# Patient Record
Sex: Male | Born: 1965 | Race: White | Hispanic: No | Marital: Married | Smoking: Never smoker
Health system: Southern US, Community
[De-identification: ages and names within clinical notes are randomized; demographics above are authoritative.]

## PROBLEM LIST (undated history)

## (undated) DIAGNOSIS — I10 Essential (primary) hypertension: Secondary | ICD-10-CM

## (undated) DIAGNOSIS — N200 Calculus of kidney: Secondary | ICD-10-CM

## (undated) DIAGNOSIS — K219 Gastro-esophageal reflux disease without esophagitis: Secondary | ICD-10-CM

## (undated) HISTORY — PX: COLONOSCOPY: SHX174

## (undated) HISTORY — DX: Gastro-esophageal reflux disease without esophagitis: K21.9

## (undated) HISTORY — PX: INGUINAL HERNIA REPAIR: SHX194

## (undated) HISTORY — PX: UPPER GASTROINTESTINAL ENDOSCOPY: SHX188

---

## 1998-11-27 ENCOUNTER — Emergency Department (HOSPITAL_COMMUNITY): Admission: EM | Admit: 1998-11-27 | Discharge: 1998-11-27 | Payer: Self-pay | Admitting: *Deleted

## 2006-02-09 ENCOUNTER — Emergency Department (HOSPITAL_COMMUNITY): Admission: EM | Admit: 2006-02-09 | Discharge: 2006-02-09 | Payer: Self-pay | Admitting: Emergency Medicine

## 2007-08-24 ENCOUNTER — Ambulatory Visit: Payer: Self-pay | Admitting: Internal Medicine

## 2007-08-24 DIAGNOSIS — I10 Essential (primary) hypertension: Secondary | ICD-10-CM | POA: Insufficient documentation

## 2007-08-24 DIAGNOSIS — J309 Allergic rhinitis, unspecified: Secondary | ICD-10-CM | POA: Insufficient documentation

## 2007-08-24 DIAGNOSIS — R079 Chest pain, unspecified: Secondary | ICD-10-CM | POA: Insufficient documentation

## 2007-08-25 ENCOUNTER — Ambulatory Visit (HOSPITAL_COMMUNITY): Admission: RE | Admit: 2007-08-25 | Discharge: 2007-08-25 | Payer: Self-pay | Admitting: Internal Medicine

## 2007-09-06 ENCOUNTER — Ambulatory Visit: Payer: Self-pay | Admitting: Internal Medicine

## 2011-08-10 ENCOUNTER — Encounter (HOSPITAL_COMMUNITY): Payer: Self-pay | Admitting: Emergency Medicine

## 2011-08-10 ENCOUNTER — Emergency Department (HOSPITAL_COMMUNITY)
Admission: EM | Admit: 2011-08-10 | Discharge: 2011-08-10 | Disposition: A | Discharge status: Home Health | Payer: BC Managed Care – PPO | Attending: Emergency Medicine | Admitting: Emergency Medicine

## 2011-08-10 ENCOUNTER — Emergency Department (HOSPITAL_COMMUNITY): Payer: BC Managed Care – PPO

## 2011-08-10 DIAGNOSIS — R319 Hematuria, unspecified: Secondary | ICD-10-CM | POA: Insufficient documentation

## 2011-08-10 DIAGNOSIS — R109 Unspecified abdominal pain: Secondary | ICD-10-CM | POA: Insufficient documentation

## 2011-08-10 DIAGNOSIS — R300 Dysuria: Secondary | ICD-10-CM | POA: Insufficient documentation

## 2011-08-10 DIAGNOSIS — N200 Calculus of kidney: Secondary | ICD-10-CM | POA: Insufficient documentation

## 2011-08-10 DIAGNOSIS — R10819 Abdominal tenderness, unspecified site: Secondary | ICD-10-CM | POA: Insufficient documentation

## 2011-08-10 DIAGNOSIS — I10 Essential (primary) hypertension: Secondary | ICD-10-CM | POA: Insufficient documentation

## 2011-08-10 DIAGNOSIS — Z79899 Other long term (current) drug therapy: Secondary | ICD-10-CM | POA: Insufficient documentation

## 2011-08-10 DIAGNOSIS — R350 Frequency of micturition: Secondary | ICD-10-CM | POA: Insufficient documentation

## 2011-08-10 HISTORY — DX: Essential (primary) hypertension: I10

## 2011-08-10 HISTORY — DX: Calculus of kidney: N20.0

## 2011-08-10 LAB — CBC
HCT: 45.1 % (ref 39.0–52.0)
Hemoglobin: 15.6 g/dL (ref 13.0–17.0)
RDW: 13 % (ref 11.5–15.5)
WBC: 12.8 10*3/uL — ABNORMAL HIGH (ref 4.0–10.5)

## 2011-08-10 LAB — DIFFERENTIAL
Basophils Absolute: 0 10*3/uL (ref 0.0–0.1)
Lymphocytes Relative: 10 % — ABNORMAL LOW (ref 12–46)
Monocytes Absolute: 1.8 10*3/uL — ABNORMAL HIGH (ref 0.1–1.0)
Monocytes Relative: 14 % — ABNORMAL HIGH (ref 3–12)
Neutro Abs: 9.5 10*3/uL — ABNORMAL HIGH (ref 1.7–7.7)
Neutrophils Relative %: 74 % (ref 43–77)

## 2011-08-10 LAB — URINALYSIS, ROUTINE W REFLEX MICROSCOPIC
Glucose, UA: NEGATIVE mg/dL
Leukocytes, UA: NEGATIVE
pH: 5.5 (ref 5.0–8.0)

## 2011-08-10 LAB — BASIC METABOLIC PANEL
CO2: 27 mEq/L (ref 19–32)
Chloride: 99 mEq/L (ref 96–112)
Creatinine, Ser: 1.51 mg/dL — ABNORMAL HIGH (ref 0.50–1.35)
Potassium: 3.8 mEq/L (ref 3.5–5.1)

## 2011-08-10 MED ORDER — MORPHINE SULFATE 4 MG/ML IJ SOLN: 4.0000 mg | Freq: Once | INTRAMUSCULAR | Status: DC

## 2011-08-10 MED ORDER — ONDANSETRON HCL 4 MG/2ML IJ SOLN: 4.0000 mg | INTRAMUSCULAR | Status: DC | PRN

## 2011-08-10 MED ORDER — NAPROXEN 500 MG PO TABS: 500.0000 mg | ORAL_TABLET | Freq: Two times a day (BID) | ORAL | Status: AC

## 2011-08-10 NOTE — ED Notes (Signed)
Pt c/o left flank pain x 5 days intermittently that feels similar to when had kidney stone in past; pt denies obvious blood in urine ;pt sts some fever also

## 2011-08-10 NOTE — Discharge Instructions (Signed)
You have been diagnosed with kidney stones.  Use your pain medication as prescribed and do not operate heavy machinery while on this medication. Note that your pain medication contains Acetaminophen (Tylenol), therefore it is not recommended to take additional Tylenol while on your pain medication. Continue to drink fluids to help you pass the stones.  Use Zofran for nausea as directed.  Followup with your primary care doctor in regards to your hospital visit.  Return to the ED immediately if you develop fever that persists > 101, uncontrolled pain or vomiting, or other concerns.  Read the instructions below to learn more about kidney stones.  If you do not have a primary care doctor to followup with, use the resource guide attached to help you find one.   Kidney Stones Kidney stones (ureteral lithiasis) are solid masses that form inside your kidneys. The intense pain is caused by the stone moving through the kidney, ureter, bladder, and urethra (urinary tract). When the stone moves, the ureter starts to spasm around the stone. The stone is usually passed in the urine.  HOME CARE  Drink enough fluids to keep your pee (urine) clear or pale yellow. This helps to get the stone out.   Only take medicine as told by your doctor.   Follow up with your doctor as told.  GET HELP RIGHT AWAY IF:   Your pain does not get better with medicine.   You have a fever.   Your pain increases and gets worse over 18 hours.   You have new belly (abdominal) pain.   You feel faint or pass out.  MAKE SURE YOU:   Understand these instructions.   Will watch your condition.   Will get help right away if you are not doing well or get worse.   RESOURCE GUIDE  Dental Problems  Patients with Medicaid: [facility]                     [facility] [address]                                           [address] Phone:  [phone]                                                   Phone:  [phone]  If unable to pay or uninsured, contact:  [facility] or [facility] to become qualified for the adult dental clinic.  Chronic Pain Problems Contact  [facility]  [phone] Patients need to be referred by their primary care doctor.  Insufficient Money for Medicine Contact [employer]:  call "211" or [facility] 271-5999.  No Primary Care Doctor Call [facility]  832-8000 Other agencies that provide inexpensive medical care    [facility]  [phone]    [facility]  [phone]    [facility]  [phone]    [facility] Clinic  [phone]    [facility]  [phone]    [facility]  [phone]  Psychological Services [facility]  [phone] [facility]  [phone] [facility]   800 853-5163 (emergency services 641-4993)    Substance Abuse Resources [facility]  336-882-2125 [facility] 336-784-9470 [facility] 336-285-9073 [facility] 336-845-3988 Residential & Outpatient Substance Abuse Program  800-659-3381  Abuse/Neglect [place] Child Abuse Hotline (336) 641-3795 [place] Child Abuse Hotline 800-378-5315 (After Hours)  Emergency Shelter [facility] (336) 271-5985  [facility] (336) 275-9566 [facility] (704) 372-4663  MRSA Hotline #:   832-7006    [place] [facility]  [facility] [place]     [employer]                          [facility] [address] [place]                       [address]      [address]  [place]                                                [place]                            [place] Phone:  [phone]                                   Phone:  [phone]                 Phone:  [phone]  [facility] Phone:   [phone]  [place] Child Abuse Hotline (336) 342-1394 (336) 342-3537 (After Hours)   

## 2011-08-10 NOTE — ED Notes (Signed)
Patient C/O Left flank pain that has been ongoing since Wednesday.  States that he has had multiple kidney stones and he has another. Denies hematuria. Pain 4/10. Pain goal 5/10

## 2011-08-10 NOTE — ED Provider Notes (Signed)
History     CSN: [id number]  Arrival date & time 08/10/11  1251   First MD Initiated Contact with Patient 08/10/11 1326      Chief Complaint  Patient presents with  . Flank Pain    (Consider location/radiation/quality/duration/timing/severity/associated sxs/prior treatment) HPI Comments: Patient with a history of hypertension kidney stones presents emergency Department with new onset of right-sided flank pain.  Patient states that onset began 5 days ago and pain has been intermittent and sharp in nature.  Patient was evaluated Friday in an urgent care however no imaging was performed.  Patient feels as though he has a kidney stone and it has not passed.  He states concern that this still might be too large to pass.  He denies urinary symptoms including dysuria, frequency, hematuria, decreased urination.  Patient reports that he's had intermittent fevers for the last couple of days as well, with the highest being 100F. Denies N/V/V, abdominal pain, chest pain, diaphoreses, SOB, claudication or palpitations. LNBM was on Thursday (pt has been taking narcotics given to him by urgent care).  Patient is a 46 y.o. male presenting with flank pain. The history is provided by the patient.  Flank Pain    Past Medical History  Diagnosis Date  . Hypertension   . Kidney stone     History reviewed. No pertinent past surgical history.  History reviewed. No pertinent family history.  History  Substance Use Topics  . Smoking status: Never Smoker   . Smokeless tobacco: Not on file  . Alcohol Use: No      Review of Systems  Genitourinary: Positive for flank pain.  All other systems reviewed and are negative.    Allergies  Sulfonamide derivatives  Home Medications   Current Outpatient Rx  Name Route Sig Dispense Refill  . CIPROFLOXACIN HCL 500 MG PO TABS Oral Take 500 mg by mouth 2 (two) times daily.    . IBUPROFEN 200 MG PO TABS Oral Take 400 mg by mouth every 6 (six) hours as  needed. For pain    . LISINOPRIL 40 MG PO TABS Oral Take 40 mg by mouth daily.    . ADULT MULTIVITAMIN W/MINERALS CH Oral Take 1 tablet by mouth daily.    . OXYCODONE-ACETAMINOPHEN 5-325 MG PO TABS Oral Take 1-2 tablets by mouth every 4 (four) hours as needed. For pain    . TAMSULOSIN HCL 0.4 MG PO CAPS Oral Take 0.4 mg by mouth daily.      BP 132/84  Pulse 70  Temp(Src) 98.5 F (36.9 C) (Oral)  Resp 20  SpO2 95%  Physical Exam  Nursing note and vitals reviewed. Constitutional: He is oriented to person, place, and time. He appears well-developed and well-nourished. No distress.  HENT:  Head: Normocephalic and atraumatic.  Eyes: Conjunctivae and EOM are normal.  Neck: Normal range of motion. Neck supple.  Cardiovascular: Normal rate, regular rhythm and normal heart sounds.   Pulmonary/Chest: Effort normal.  Abdominal: Soft. Normal appearance and bowel sounds are normal. He exhibits no distension, no ascites and no pulsatile midline mass. There is tenderness (CVA tenderness). There is CVA tenderness.  Musculoskeletal: Normal range of motion.  Neurological: He is alert and oriented to person, place, and time.  Skin: Skin is warm and dry. He is not diaphoretic.  Psychiatric: He has a normal mood and affect. His behavior is normal.    ED Course  Procedures (including critical care time)  Labs Reviewed  CBC - Abnormal; Notable for the  following:    WBC 12.8 (*)    All other components within normal limits  DIFFERENTIAL - Abnormal; Notable for the following:    Neutro Abs 9.5 (*)    Lymphocytes Relative 10 (*)    Monocytes Relative 14 (*)    Monocytes Absolute 1.8 (*)    All other components within normal limits  URINALYSIS, ROUTINE W REFLEX MICROSCOPIC  BASIC METABOLIC PANEL   Ct Abdomen Pelvis Wo Contrast  08/10/2011  *RADIOLOGY REPORT*  Clinical Data: Left flank pain  CT ABDOMEN AND PELVIS WITHOUT CONTRAST  Technique:  Multidetector CT imaging of the abdomen and pelvis was  performed following the standard protocol without intravenous contrast.  Comparison: None.  Findings: 2 mm calculus at the left ureteral vesicle junction is associated with mild left hydronephrosis.  1 cm focal hyperdensity in the upper pole of the left kidney on image 37. This is indeterminate.  Simple cyst in the lower pole of the left kidney.  1 cm hypodensity in the posterior interpolar right kidney with the rim calcifications. This is also indeterminate.  Ill-defined 1.5 cm hypodensity in the right lobe of the liver on image 34.  Extensive stool in the ascending and transverse colon.  Pancreas, spleen, adrenal glands are within normal limits.  Normal appendix.  No destructive bone lesion.  IMPRESSION: 2 mm distal left ureteral calculus associated with left hydronephrosis.  Indeterminate lesions in the liver and kidneys.  If the patient is at high risk for malignancy, MRI is recommended to further characterize.  Otherwise, 38-month follow-up studies recommended to ensure stability.  Original Report Authenticated By: Donavan Burnet, M.D.     No diagnosis found.    MDM  Kidney stone  Pt has been diagnosed with a Kidney Stone via CT. There is no evidence of significant hydronephrosis, serum creatine WNL, vitals sign stable and the pt does not have irratractable vomiting. Pt will be dc home with pain medications & has been advised to follow up with PCP. Imaging results discussed w pt. He is a non-smoker with no CA hx personally and no fam hx of CA as well. Pt has been advised to have a repeat scan for follow up in 6 month. He is agreeable w plan adn verbalizes understanding.          Jaci Carrel, [place] 08/10/11 4401

## 2011-08-11 NOTE — ED Provider Notes (Signed)
Medical screening examination/treatment/procedure(s) were performed by non-physician practitioner and as supervising physician I was immediately available for consultation/collaboration.   Esparanza Krider, MD 08/11/11 1453 

## 2011-08-28 ENCOUNTER — Other Ambulatory Visit: Payer: Self-pay | Admitting: Family Medicine

## 2011-08-28 DIAGNOSIS — K7689 Other specified diseases of liver: Secondary | ICD-10-CM

## 2011-08-28 DIAGNOSIS — N289 Disorder of kidney and ureter, unspecified: Secondary | ICD-10-CM

## 2011-09-01 ENCOUNTER — Ambulatory Visit
Admission: RE | Admit: 2011-09-01 | Discharge: 2011-09-01 | Disposition: A | Discharge status: Home / Self Care | Payer: BC Managed Care – PPO | Source: Ambulatory Visit | Attending: Family Medicine | Admitting: Family Medicine

## 2011-09-01 DIAGNOSIS — N289 Disorder of kidney and ureter, unspecified: Secondary | ICD-10-CM

## 2011-09-01 DIAGNOSIS — K7689 Other specified diseases of liver: Secondary | ICD-10-CM

## 2011-09-01 MED ORDER — GADOBENATE DIMEGLUMINE 529 MG/ML IV SOLN
19.0000 mL | Freq: Once | INTRAVENOUS | Status: AC | PRN
  Administered 2011-09-01: 19 mL via INTRAVENOUS

## 2012-12-19 ENCOUNTER — Ambulatory Visit: Payer: Self-pay | Admitting: Family Medicine

## 2012-12-21 ENCOUNTER — Other Ambulatory Visit: Payer: Self-pay | Admitting: Family Medicine

## 2012-12-21 MED ORDER — LISINOPRIL 40 MG PO TABS: 40.0000 mg | ORAL_TABLET | Freq: Every day | ORAL | Status: DC

## 2012-12-21 NOTE — Telephone Encounter (Signed)
.  Rx Refilled and pt needs ov before further refills

## 2012-12-22 ENCOUNTER — Ambulatory Visit (INDEPENDENT_AMBULATORY_CARE_PROVIDER_SITE_OTHER): Payer: BC Managed Care – PPO | Admitting: Family Medicine

## 2012-12-22 ENCOUNTER — Encounter: Payer: Self-pay | Admitting: Family Medicine

## 2012-12-22 VITALS — BP 110/80 | HR 78 | Temp 98.2°F | Resp 16 | Wt 218.0 lb

## 2012-12-22 DIAGNOSIS — R51 Headache: Secondary | ICD-10-CM

## 2012-12-22 DIAGNOSIS — I10 Essential (primary) hypertension: Secondary | ICD-10-CM

## 2012-12-22 MED ORDER — FLUTICASONE PROPIONATE 50 MCG/ACT NA SUSP: 2.0000 | Freq: Every day | NASAL | Status: DC

## 2012-12-22 NOTE — Progress Notes (Signed)
Subjective:    Patient ID: Bryan Davenport, Bryan Davenport    DOB: 07/27/1965, 47 y.o.   MRN: [id number]  HPI  Patient comes in today followup hypertension. He has not been taking lisinopril regularly. In fact he takes it every third day. He has not taken medication over 48 hours. His blood pressure today is 110/80. He denies any chest pain, shortness of breath, dyspnea on exertion. He refuses a flu shot. He refuses a CMP and a fasting lipid panel.  He does complain of a chronic daily headache. It is located over his frontal sinuses and maxillary sinuses. This has been episodic for years. He does have congestion. However he also reports some photophobia and some phonophobia. Sometimes smells trigger his headache. He takes Advil almost on a daily basis for headache. Again this is been occurring for years. Past Medical History  Diagnosis Date  . Hypertension   . Kidney stone     Current Outpatient Prescriptions on File Prior to Visit  Medication Sig Dispense Refill  . ibuprofen (ADVIL,MOTRIN) 200 MG tablet Take 400 mg by mouth every 6 (six) hours as needed. For pain      . lisinopril (PRINIVIL,ZESTRIL) 40 MG tablet Take 1 tablet (40 mg total) by mouth daily.  90 tablet  0  . Multiple Vitamin (MULITIVITAMIN WITH MINERALS) TABS Take 1 tablet by mouth daily.       No current facility-administered medications on file prior to visit.   Allergies  Allergen Reactions  . Sulfonamide Derivatives Rash   History   Social History  . Marital Status: Married    Spouse Name: N/A    Number of Children: N/A  . Years of Education: N/A   Occupational History  . Not on file.   Social History Main Topics  . Smoking status: Never Smoker   . Smokeless tobacco: Not on file  . Alcohol Use: No  . Drug Use: No  . Sexual Activity:    Other Topics Concern  . Not on file   Social History Narrative  . No narrative on file     Review of Systems  All other systems reviewed and are negative.        Objective:   Physical Exam  Vitals reviewed. Constitutional: He is oriented to person, place, and time. He appears well-developed and well-nourished.  HENT:  Nose: Nose normal.  Mouth/Throat: Oropharynx is clear and moist.  Cardiovascular: Normal rate, regular rhythm and normal heart sounds.  Exam reveals no gallop and no friction rub.   No murmur heard. Pulmonary/Chest: Effort normal and breath sounds normal. No respiratory distress. He has no wheezes. He has no rales.  Abdominal: Soft. Bowel sounds are normal. He exhibits no distension. There is no tenderness. There is no rebound and no guarding.  Neurological: He is alert and oriented to person, place, and time. He has normal reflexes.          Assessment & Plan:  1. HTN (hypertension) Patient declines all lab work. I recommended discontinuing lisinopril. I asked the patient to monitor his blood pressure daily at home. His blood pressure rises greater than 140/90 I would resume the medication. At the present time it seems like it may be controlled without medication.  2. Chronic headaches I believe there is a multi-factorial cause to his headaches. He may be having atypical migraines. He quite possibly has rebound headaches. There may also be an element of chronic sinusitis.  Begin Flonase 2 sprays each nostril daily.  I  also recommended he discontinue all over-the-counter Tylenol and NSAIDs. Recheck in 3 weeks. If headaches do not improve consider Topamax. - fluticasone (FLONASE) 50 MCG/ACT nasal spray; Place 2 sprays into the nose daily.  Dispense: 16 g; Refill: 6

## 2013-02-23 ENCOUNTER — Ambulatory Visit (INDEPENDENT_AMBULATORY_CARE_PROVIDER_SITE_OTHER): Payer: BC Managed Care – PPO | Admitting: Physician Assistant

## 2013-02-23 ENCOUNTER — Encounter: Payer: Self-pay | Admitting: Physician Assistant

## 2013-02-23 VITALS — BP 146/102 | HR 80 | Temp 98.8°F | Resp 20 | Wt 220.0 lb

## 2013-02-23 DIAGNOSIS — A499 Bacterial infection, unspecified: Secondary | ICD-10-CM

## 2013-02-23 DIAGNOSIS — J988 Other specified respiratory disorders: Secondary | ICD-10-CM

## 2013-02-23 MED ORDER — AZITHROMYCIN 250 MG PO TABS: ORAL_TABLET | ORAL | Status: DC

## 2013-02-23 NOTE — Progress Notes (Signed)
    Patient ID: Bryan Davenport MRN: [id number], DOB: 1965/12/16, 47 y.o. Date of Encounter: 02/23/2013, 4:02 PM    Chief Complaint:  Chief Complaint  Patient presents with  . congestion, body aches, sore throat    x 5 days, using OTC Acetominophen Sinus     HPI: 47 y.o. year old white male reports he has had increased nasal congestion and pressure behind his ears. Past 2 mornings, has hadsever sore throat-gets better during the day. Not able to get much mucus out of nose. Feels a little chest conestion but not able to get any phlegm out.      Home Meds: See attached medication section for any medications that were entered at today's visit. The computer does not put those onto this list.The following list is a list of meds entered prior to today's visit.   Current Outpatient Prescriptions on File Prior to Visit  Medication Sig Dispense Refill  . ibuprofen (ADVIL,MOTRIN) 200 MG tablet Take 400 mg by mouth every 6 (six) hours as needed. For pain      . lisinopril (PRINIVIL,ZESTRIL) 40 MG tablet Take 1 tablet (40 mg total) by mouth daily.  90 tablet  0  . Multiple Vitamin (MULITIVITAMIN WITH MINERALS) TABS Take 1 tablet by mouth daily.      . fluticasone (FLONASE) 50 MCG/ACT nasal spray Place 2 sprays into the nose daily.  16 g  6   No current facility-administered medications on file prior to visit.    Allergies:  Allergies  Allergen Reactions  . Sulfonamide Derivatives Rash      Review of Systems: See HPI for pertinent ROS. All other ROS negative.    Physical Exam: Blood pressure 146/92, pulse 80, temperature 98.8 F (37.1 C), temperature source Oral, resp. rate 20, weight 220 lb (99.791 kg)., Body mass index is 30.7 kg/(m^2). General:  WNWD WM. Appears in no acute distress. HEENT: Normocephalic, atraumatic, eyes without discharge, sclera non-icteric, nares are without discharge. Bilateral auditory canals clear, TM's are without perforation, pearly grey and translucent with  reflective cone of light bilaterally. Oral cavity moist, posterior pharynx without exudate, erythema, peritonsillar abscess. Mild tenderness with percussion of frontal sinuses. No tenderness with percussion of maxillary sinuses.   Neck: Supple. No thyromegaly. No lymphadenopathy. Lungs: Clear bilaterally to auscultation without wheezes, rales, or rhonchi. Breathing is unlabored. Heart: Regular rhythm. No murmurs, rubs, or gallops. Msk:  Strength and tone normal for age. Extremities/Skin: Warm and dry. No clubbing or cyanosis. No edema. No rashes or suspicious lesions. Neuro: Alert and oriented X 3. Moves all extremities spontaneously. Gait is normal. CNII-XII grossly in tact. Psych:  Responds to questions appropriately with a normal affect.     ASSESSMENT AND PLAN:  47 y.o. year old male with  1. Bacterial respiratory infection - azithromycin (ZITHROMAX) 250 MG tablet; Day 1: Take 2 daily.  Days 2-5: Take 1 daily.  Dispense: 6 tablet; Refill: 0 Rec Mucinex DM as expectorant. F/U if does not resolve.   Murray Hodgkins Dawson, [place], [facility] 02/23/2013 4:02 PM

## 2013-05-08 IMAGING — CT CT ABD-PELV W/O CM
2 of 4 series · 14 of 32 positions shown, 19 images · non-contrast
Comparison: None.

CLINICAL DATA: Left flank pain

CT ABDOMEN AND PELVIS WITHOUT CONTRAST
TECHNIQUE: Multidetector CT imaging of the abdomen and pelvis was
performed following the standard protocol without intravenous
contrast.

[Series 2: renal stone · axial · 0.77mm/px · z∈[-452,-42]mm · 8 of 106 slices shown, 13 images]
[im 12/106  soft-tissue]
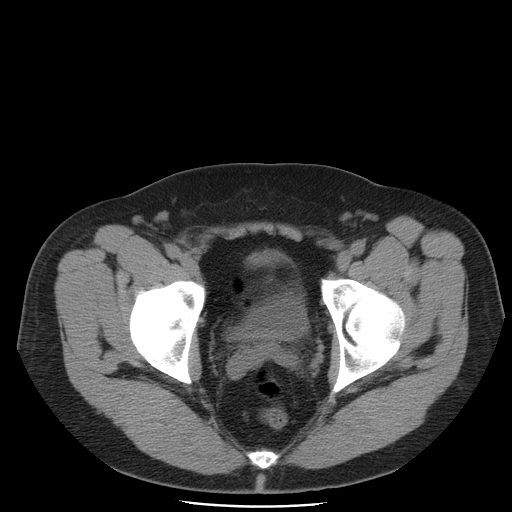
[im 12/106  bone]
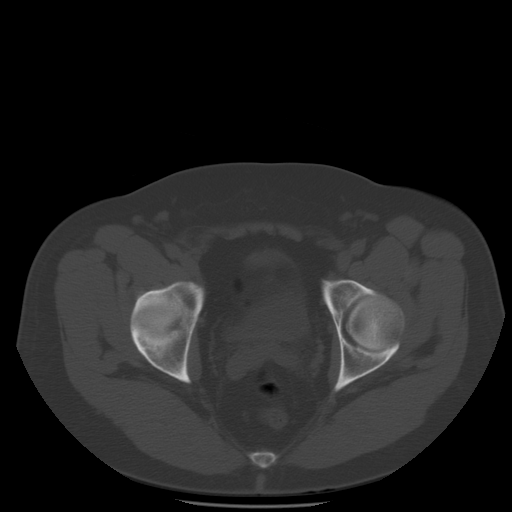
[im 24/106  soft-tissue]
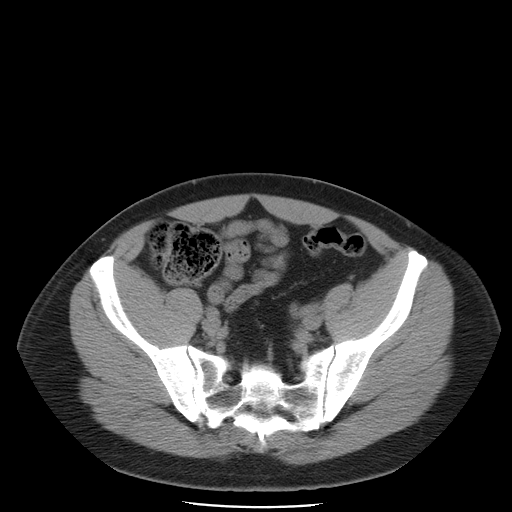
[im 36/106  soft-tissue]
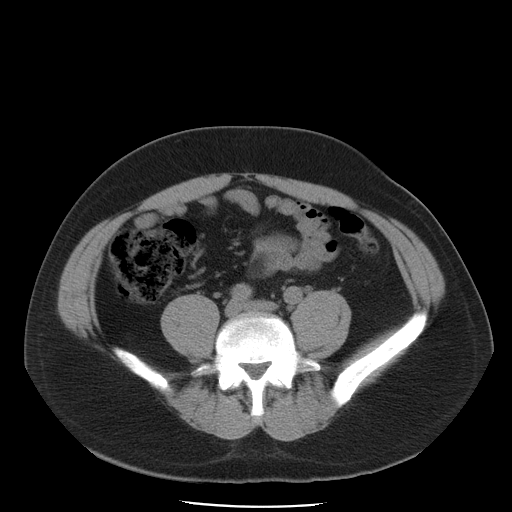
[im 47/106  soft-tissue]
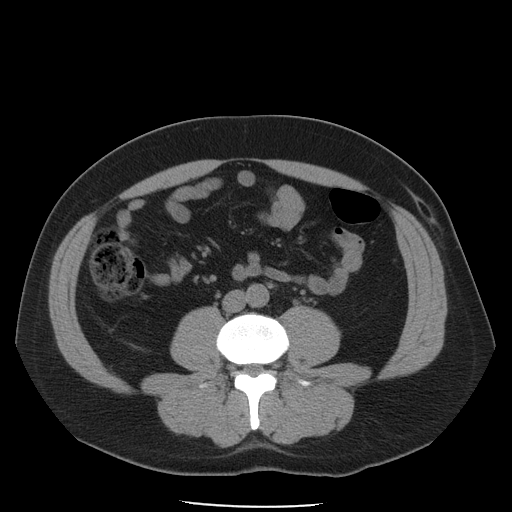
[im 59/106  soft-tissue]
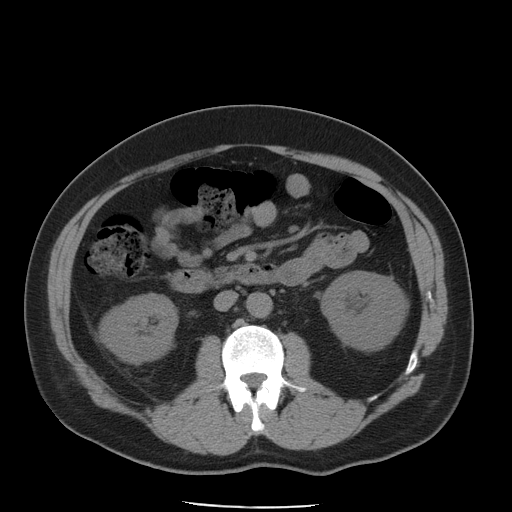
[im 59/106  lung]
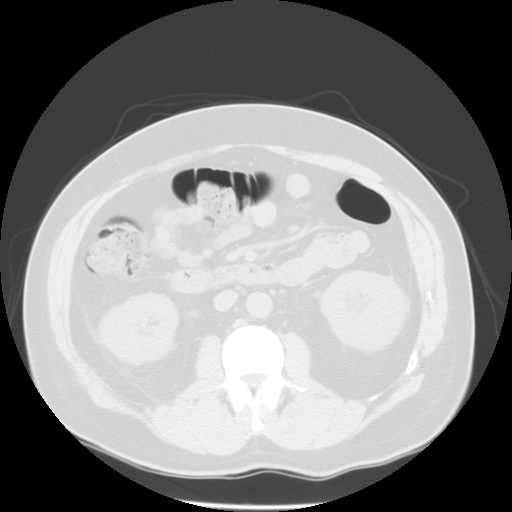
[im 71/106  soft-tissue]
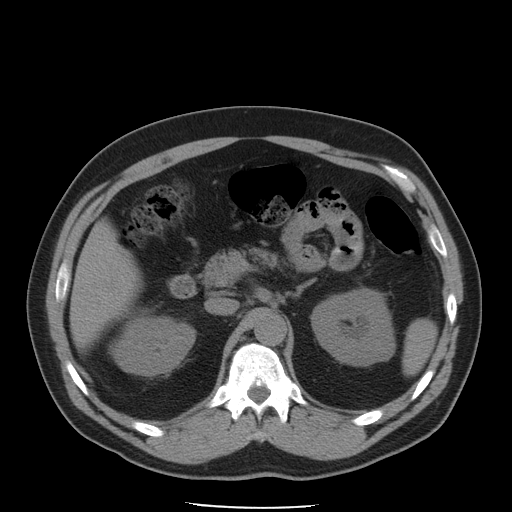
[im 71/106  lung]
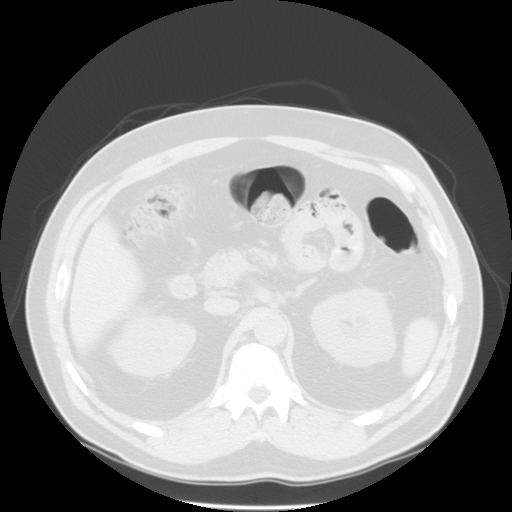
[im 82/106  soft-tissue]
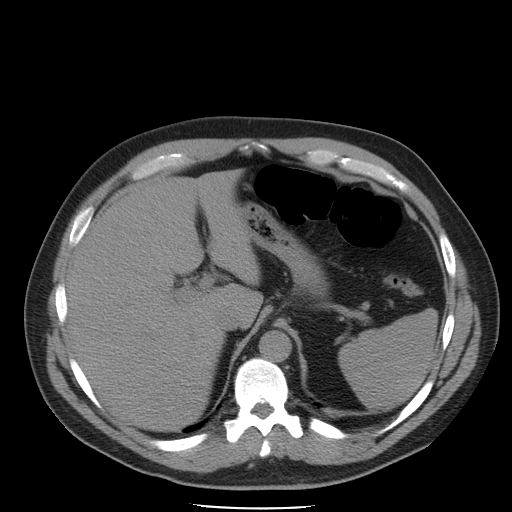
[im 82/106  lung]
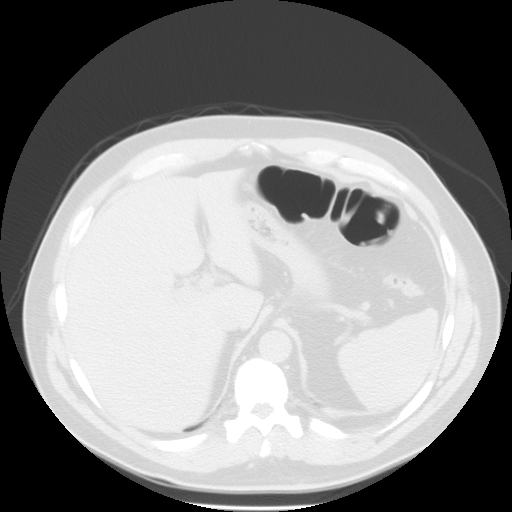
[im 94/106  soft-tissue]
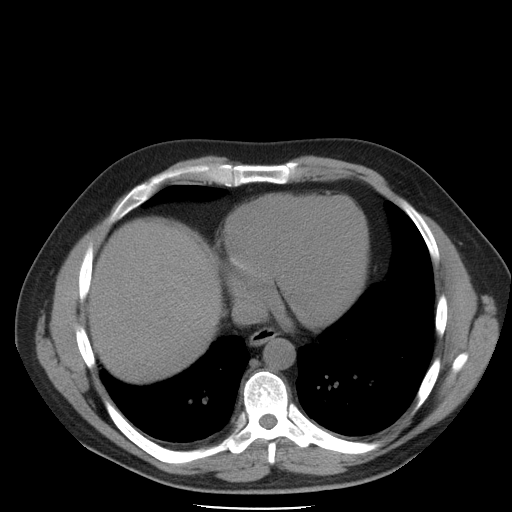
[im 94/106  lung]
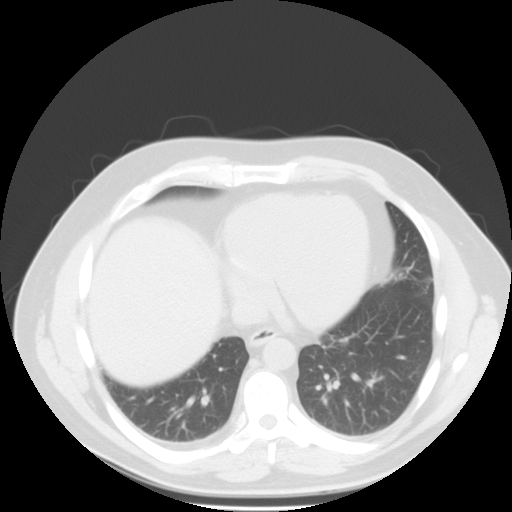

[Series 401: sagittals · sagittal · 1.13mm/px · 6 of 114 slices shown]
[im 13/114  soft-tissue]
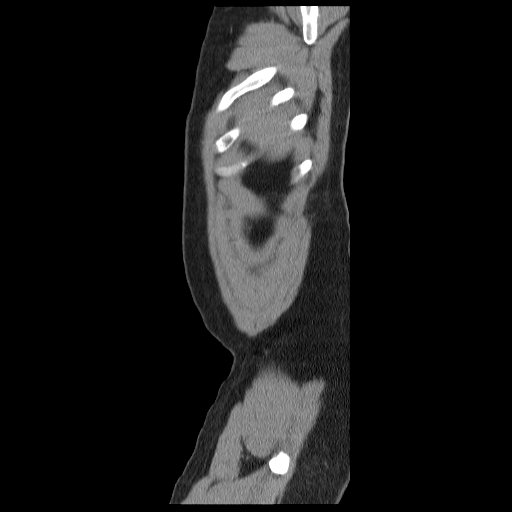
[im 26/114  soft-tissue]
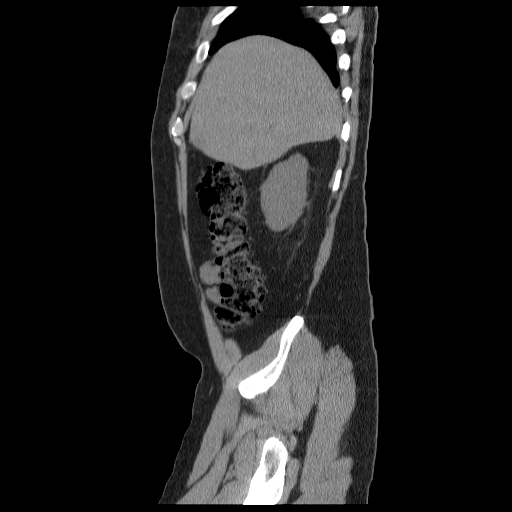
[im 38/114  soft-tissue]
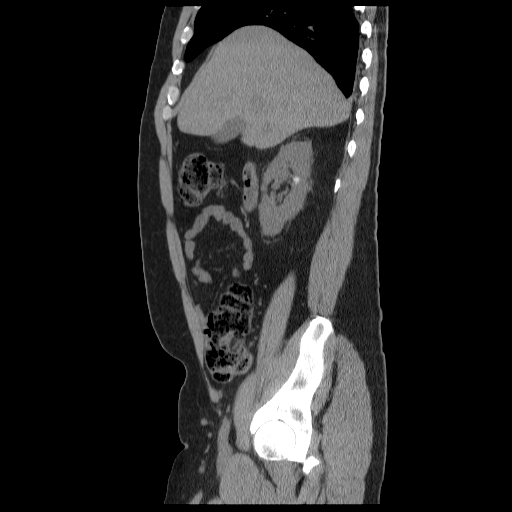
[im 51/114  soft-tissue]
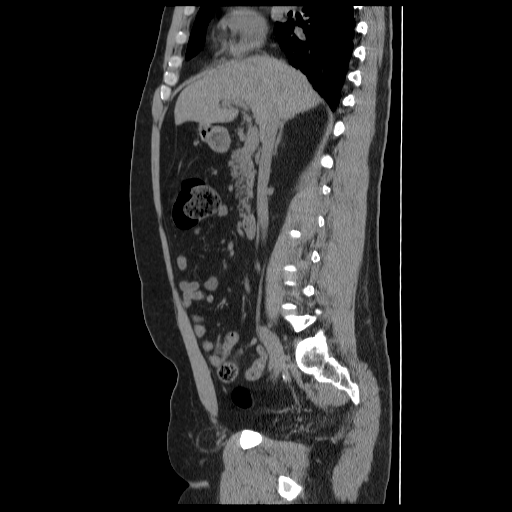
[im 63/114  soft-tissue]
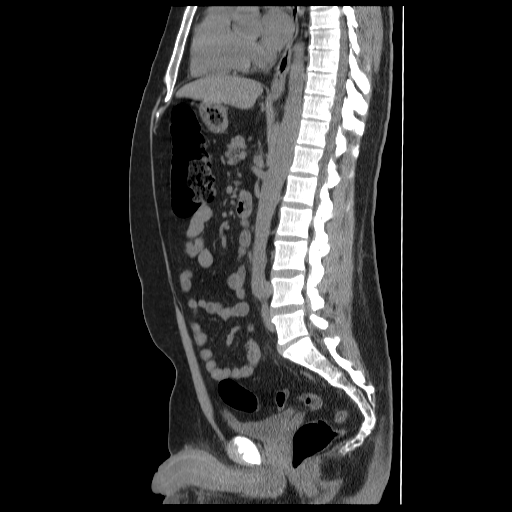
[im 76/114  soft-tissue]
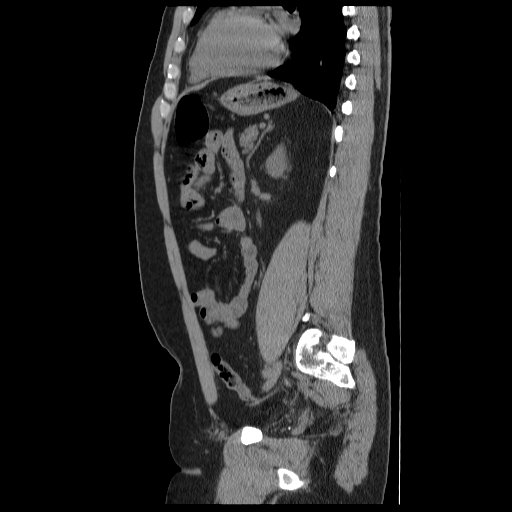

[14 of 32 positions shown; findings below may reference images not displayed]

FINDINGS: 2 mm calculus at the left ureteral vesicle junction is
associated with mild left hydronephrosis.

1 cm focal hyperdensity in the upper pole of the left kidney on
image 37. This is indeterminate.  Simple cyst in the lower pole of
the left kidney.  1 cm hypodensity in the posterior interpolar
right kidney with the rim calcifications. This is also
indeterminate.

Ill-defined 1.5 cm hypodensity in the right lobe of the liver on
image 34.

Extensive stool in the ascending and transverse colon.

Pancreas, spleen, adrenal glands are within normal limits.  Normal
appendix.

No destructive bone lesion.
IMPRESSION: 2 mm distal left ureteral calculus associated with left
hydronephrosis.

Indeterminate lesions in the liver and kidneys.  If the patient is
at high risk for malignancy, MRI is recommended to further
characterize.  Otherwise, 6-month follow-up studies recommended to
ensure stability.

## 2013-05-15 ENCOUNTER — Other Ambulatory Visit: Payer: Self-pay | Admitting: Family Medicine

## 2013-05-16 NOTE — Telephone Encounter (Signed)
Refill appropriate and filled per protocol. 

## 2013-05-23 ENCOUNTER — Other Ambulatory Visit: Payer: Self-pay | Admitting: Physician Assistant

## 2013-05-23 ENCOUNTER — Other Ambulatory Visit: Payer: Self-pay | Admitting: Family Medicine

## 2013-05-23 DIAGNOSIS — I10 Essential (primary) hypertension: Secondary | ICD-10-CM

## 2013-05-24 ENCOUNTER — Encounter: Payer: Self-pay | Admitting: Family Medicine

## 2013-05-24 NOTE — Telephone Encounter (Signed)
Medication refill for one time only.  Patient needs to be seen.  Letter sent for patient to call and schedule 

## 2013-10-02 ENCOUNTER — Telehealth: Payer: Self-pay | Admitting: *Deleted

## 2013-10-02 NOTE — Telephone Encounter (Signed)
Pt called today wanting to know why pt can not get any more refills on his lisinopril, I looked up pts med list and stated that the reason was he is due for office visit before further refills, pt states he was there just a couple months ago(Feb 2015),I informed pt the last office visit was in Dec 2014, she states that is not true and that someone did not do their job, I informed pt that when appt are made it shows up in [facility] and she was very adament that she was here since then, I told her she wasn't and that before further refills needs to be seen and Blood work. She states she will call back and make appointment once she finds out what pts schedule is.

## 2013-10-19 ENCOUNTER — Other Ambulatory Visit: Payer: Self-pay | Admitting: *Deleted

## 2013-10-19 DIAGNOSIS — I10 Essential (primary) hypertension: Secondary | ICD-10-CM

## 2013-10-19 MED ORDER — LISINOPRIL 40 MG PO TABS: ORAL_TABLET | ORAL | Status: DC

## 2013-10-19 NOTE — Telephone Encounter (Signed)
Patient appointment scheduled for 10/24/2013.   HTN medication refilled x1 month.

## 2013-10-24 ENCOUNTER — Encounter: Payer: Self-pay | Admitting: Family Medicine

## 2013-10-24 ENCOUNTER — Ambulatory Visit (INDEPENDENT_AMBULATORY_CARE_PROVIDER_SITE_OTHER): Payer: BC Managed Care – PPO | Admitting: Family Medicine

## 2013-10-24 VITALS — BP 130/92 | HR 68 | Temp 98.4°F | Resp 17 | Wt 217.0 lb

## 2013-10-24 DIAGNOSIS — I10 Essential (primary) hypertension: Secondary | ICD-10-CM

## 2013-10-24 LAB — CBC WITH DIFFERENTIAL/PLATELET
BASOS ABS: 0 10*3/uL (ref 0.0–0.1)
BASOS PCT: 0 % (ref 0–1)
EOS ABS: 0.2 10*3/uL (ref 0.0–0.7)
Eosinophils Relative: 3 % (ref 0–5)
HCT: 48.1 % (ref 39.0–52.0)
Hemoglobin: 16.4 g/dL (ref 13.0–17.0)
Lymphocytes Relative: 29 % (ref 12–46)
Lymphs Abs: 2.3 10*3/uL (ref 0.7–4.0)
MCH: 31 pg (ref 26.0–34.0)
MCHC: 34.1 g/dL (ref 30.0–36.0)
MCV: 90.9 fL (ref 78.0–100.0)
Monocytes Absolute: 0.7 10*3/uL (ref 0.1–1.0)
Monocytes Relative: 9 % (ref 3–12)
NEUTROS PCT: 59 % (ref 43–77)
Neutro Abs: 4.6 10*3/uL (ref 1.7–7.7)
PLATELETS: 277 10*3/uL (ref 150–400)
RBC: 5.29 MIL/uL (ref 4.22–5.81)
RDW: 13.7 % (ref 11.5–15.5)
WBC: 7.8 10*3/uL (ref 4.0–10.5)

## 2013-10-24 LAB — COMPLETE METABOLIC PANEL WITH GFR
ALT: 28 U/L (ref 0–53)
AST: 21 U/L (ref 0–37)
Albumin: 4.4 g/dL (ref 3.5–5.2)
Alkaline Phosphatase: 85 U/L (ref 39–117)
BUN: 14 mg/dL (ref 6–23)
CO2: 32 mEq/L (ref 19–32)
Calcium: 9.5 mg/dL (ref 8.4–10.5)
Chloride: 104 mEq/L (ref 96–112)
Creat: 0.99 mg/dL (ref 0.50–1.35)
GFR, Est African American: >89 mL/min
GFR, Est Non African American: >89 mL/min
Glucose, Bld: 95 mg/dL (ref 70–99)
Potassium: 4.7 mEq/L (ref 3.5–5.3)
Sodium: 144 mEq/L (ref 135–145)
Total Bilirubin: 0.8 mg/dL (ref 0.2–1.2)
Total Protein: 6.7 g/dL (ref 6.0–8.3)

## 2013-10-24 LAB — LIPID PANEL
CHOL/HDL RATIO: 4 ratio
Cholesterol: 172 mg/dL (ref 0–200)
HDL: 43 mg/dL (ref >39)
LDL CALC: 108 mg/dL — AB (ref 0–99)
Triglycerides: 103 mg/dL (ref <150)
VLDL: 21 mg/dL (ref 0–40)

## 2013-10-24 MED ORDER — LISINOPRIL 40 MG PO TABS: ORAL_TABLET | ORAL | Status: DC

## 2013-10-24 NOTE — Progress Notes (Signed)
   Subjective:    Patient ID: Bryan Davenport, male    DOB: 1966-01-02, 48 y.o.   MRN: [id number]  HPI Patient is a very pleasant 48 year old white male who presents today for followup of his hypertension. He is currently taking lisinopril 40 mg by mouth daily. He denies any chest pain shortness of breath or dyspnea on exertion. He denies any angioedema. He denies any cough. His blood pressure is borderline high today at 130/92. The patient is not checking his blood pressure at home. He does have a history of chronic kidney disease with baseline creatinine of 1.5. Past Medical History  Diagnosis Date  . Hypertension   . Kidney stone    No past surgical history on file. Current Outpatient Prescriptions on File Prior to Visit  Medication Sig Dispense Refill  . ibuprofen (ADVIL,MOTRIN) 200 MG tablet Take 400 mg by mouth every 6 (six) hours as needed. For pain      . Multiple Vitamin (MULITIVITAMIN WITH MINERALS) TABS Take 1 tablet by mouth daily.       No current facility-administered medications on file prior to visit.   Allergies  Allergen Reactions  . Sulfonamide Derivatives Rash   History   Social History  . Marital Status: Married    Spouse Name: N/A    Number of Children: N/A  . Years of Education: N/A   Occupational History  . Not on file.   Social History Main Topics  . Smoking status: Never Smoker   . Smokeless tobacco: Not on file  . Alcohol Use: No  . Drug Use: No  . Sexual Activity:    Other Topics Concern  . Not on file   Social History Narrative  . No narrative on file      Review of Systems  All other systems reviewed and are negative.      Objective:   Physical Exam  Vitals reviewed. Constitutional: He appears well-developed and well-nourished. No distress.  Cardiovascular: Normal rate, regular rhythm and normal heart sounds.  Exam reveals no gallop and no friction rub.   No murmur heard. Pulmonary/Chest: Effort normal and breath sounds normal.  No respiratory distress. He has no wheezes. He has no rales. He exhibits no tenderness.  Abdominal: Soft. Bowel sounds are normal. He exhibits no distension. There is no tenderness. There is no rebound and no guarding.  Musculoskeletal: He exhibits no edema.  Skin: He is not diaphoretic.          Assessment & Plan:  1. Essential hypertension Given the patient's chronic kidney disease, I recommended that he avoid NSAIDs.  Facet patient to return next week for a recheck his blood pressure. His blood pressures consistently greater than 140/90, I recommend adding amlodipine. I will check a CBC, CMP, and fasting lipid panel. - CBC with Differential - COMPLETE METABOLIC PANEL WITH GFR - Lipid panel - lisinopril (PRINIVIL,ZESTRIL) 40 MG tablet; TAKE ONE TABLET BY MOUTH ONCE DAILY  Dispense: 30 tablet; Refill: 11

## 2013-10-27 ENCOUNTER — Encounter: Payer: Self-pay | Admitting: *Deleted

## 2013-10-31 ENCOUNTER — Telehealth: Payer: Self-pay | Admitting: Family Medicine

## 2013-10-31 NOTE — Telephone Encounter (Signed)
[phone]  PT wife has called wanting to know the lab results on pt they haven't heard about the results

## 2013-11-02 NOTE — Telephone Encounter (Signed)
LMTRC

## 2013-11-04 NOTE — Telephone Encounter (Signed)
Call placed to patient.   States that letter with results has been received.

## 2014-06-01 ENCOUNTER — Telehealth: Payer: Self-pay | Admitting: Family Medicine

## 2014-07-10 ENCOUNTER — Telehealth: Payer: Self-pay | Admitting: Family Medicine

## 2014-07-10 DIAGNOSIS — I10 Essential (primary) hypertension: Secondary | ICD-10-CM

## 2014-07-10 MED ORDER — LISINOPRIL 40 MG PO TABS: ORAL_TABLET | ORAL | Status: DC

## 2014-07-10 NOTE — Telephone Encounter (Signed)
Patients wife called in requesting 3 month supply on lisinopril called into [facility] on [address]. She states patient has 2 pills left.

## 2014-07-10 NOTE — Telephone Encounter (Signed)
Medication called/sent to requested pharmacy  

## 2015-07-04 ENCOUNTER — Encounter: Payer: Self-pay | Admitting: Family Medicine

## 2015-07-04 ENCOUNTER — Ambulatory Visit (INDEPENDENT_AMBULATORY_CARE_PROVIDER_SITE_OTHER): Payer: Self-pay | Admitting: Family Medicine

## 2015-07-04 VITALS — BP 142/86 | HR 78 | Temp 98.5°F | Resp 18 | Ht 71.0 in | Wt 216.0 lb

## 2015-07-04 DIAGNOSIS — I10 Essential (primary) hypertension: Secondary | ICD-10-CM

## 2015-07-04 NOTE — Progress Notes (Signed)
   Subjective:    Patient ID: Bryan Davenport, male    DOB: [birth year], 50 y.o.   MRN: [id number]  HPI Patient is here today for follow-up of his hypertension. He is currently on lisinopril 40 mg by mouth daily. His blood pressure is elevated today at 142/86. He is not checking it regularly at home. He admits to eating a diet high in sodium and not getting enough exercise. His prostate was checked within the last 6 months in [place] including a PSA. He is now due for a colonoscopy. He is also due for tetanus shot. Review of systems is only significant for indigestion Past Medical History  Diagnosis Date  . Hypertension   . Kidney stone    No past surgical history on file. Current Outpatient Prescriptions on File Prior to Visit  Medication Sig Dispense Refill  . lisinopril (PRINIVIL,ZESTRIL) 40 MG tablet TAKE ONE TABLET BY MOUTH ONCE DAILY 90 tablet 1  . Multiple Vitamin (MULITIVITAMIN WITH MINERALS) TABS Take 1 tablet by mouth daily.     No current facility-administered medications on file prior to visit.   Allergies  Allergen Reactions  . Sulfonamide Derivatives Rash   Social History   Social History  . Marital Status: Married    Spouse Name: N/A  . Number of Children: N/A  . Years of Education: N/A   Occupational History  . Not on file.   Social History Main Topics  . Smoking status: Never Smoker   . Smokeless tobacco: Not on file  . Alcohol Use: No  . Drug Use: No  . Sexual Activity: Not on file   Other Topics Concern  . Not on file   Social History Narrative      Review of Systems  All other systems reviewed and are negative.      Objective:   Physical Exam  Constitutional: He appears well-developed and well-nourished.  Neck: No JVD present.  Cardiovascular: Normal rate, regular rhythm and normal heart sounds.   No murmur heard. Pulmonary/Chest: Effort normal and breath sounds normal. No respiratory distress. He has no wheezes. He has no rales.    Abdominal: Soft. Bowel sounds are normal. He exhibits no distension. There is no tenderness. There is no rebound.  Musculoskeletal: He exhibits no edema.  Vitals reviewed.         Assessment & Plan:  Essential hypertension - Plan: CBC with Differential/Platelet, COMPLETE METABOLIC PANEL WITH GFR, Lipid panel  Blood pressure is elevated. I will like the patient to check his blood pressure at home and also come by here tomorrow and let us recheck it. If consistently greater than 140 systolic, I would switch lisinopril to Zestoretic 40/12.5  by mouth daily.  I would also like him to return fasting so I could check a CBC, CMP, fasting lipid panel. I recommended a colonoscopy but he prefers to defer this at the present time. Prostate is checked by urologist in [place] per the patient's report

## 2015-07-05 ENCOUNTER — Telehealth: Payer: Self-pay | Admitting: Family Medicine

## 2015-07-05 ENCOUNTER — Other Ambulatory Visit: Payer: Self-pay

## 2015-07-05 ENCOUNTER — Ambulatory Visit: Payer: Self-pay | Admitting: Family Medicine

## 2015-07-05 VITALS — BP 134/88

## 2015-07-05 DIAGNOSIS — I10 Essential (primary) hypertension: Secondary | ICD-10-CM

## 2015-07-05 LAB — COMPLETE METABOLIC PANEL WITH GFR
ALBUMIN: 4.2 g/dL (ref 3.6–5.1)
ALK PHOS: 86 U/L (ref 40–115)
ALT: 32 U/L (ref 9–46)
AST: 21 U/L (ref 10–35)
BILIRUBIN TOTAL: 0.6 mg/dL (ref 0.2–1.2)
BUN: 13 mg/dL (ref 7–25)
CO2: 27 mmol/L (ref 20–31)
CREATININE: 1.02 mg/dL (ref 0.70–1.33)
Calcium: 9.3 mg/dL (ref 8.6–10.3)
Chloride: 107 mmol/L (ref 98–110)
GFR, EST NON AFRICAN AMERICAN: 85 mL/min (ref >=60)
Glucose, Bld: 99 mg/dL (ref 70–99)
Potassium: 4 mmol/L (ref 3.5–5.3)
Sodium: 143 mmol/L (ref 135–146)
TOTAL PROTEIN: 6.6 g/dL (ref 6.1–8.1)

## 2015-07-05 LAB — LIPID PANEL
Cholesterol: 184 mg/dL (ref 125–200)
HDL: 44 mg/dL (ref >=40)
LDL CALC: 122 mg/dL (ref <130)
TRIGLYCERIDES: 90 mg/dL (ref <150)
Total CHOL/HDL Ratio: 4.2 Ratio (ref <=5.0)
VLDL: 18 mg/dL (ref <30)

## 2015-07-05 MED ORDER — LISINOPRIL 40 MG PO TABS: ORAL_TABLET | ORAL | Status: DC

## 2015-07-05 NOTE — Telephone Encounter (Signed)
Medication called/sent to requested pharmacy  

## 2015-07-05 NOTE — Telephone Encounter (Signed)
Patient is requesting a refill on his lisinopril (PRINIVIL,ZESTRIL) 40 MG tablet he would like this called into [facility] [address]  CB# [phone]

## 2015-07-06 LAB — CBC WITH DIFFERENTIAL/PLATELET
BASOS ABS: 0 {cells}/uL (ref 0–200)
BASOS PCT: 0 %
EOS ABS: 154 {cells}/uL (ref 15–500)
Eosinophils Relative: 2 %
HEMATOCRIT: 47.9 % (ref 38.5–50.0)
HEMOGLOBIN: 16 g/dL (ref 13.0–17.0)
LYMPHS ABS: 2002 {cells}/uL (ref 850–3900)
Lymphocytes Relative: 26 %
MCH: 30.5 pg (ref 27.0–33.0)
MCHC: 33.4 g/dL (ref 32.0–36.0)
MCV: 91.2 fL (ref 80.0–100.0)
MONO ABS: 693 {cells}/uL (ref 200–950)
MPV: 9.5 fL (ref 7.5–12.5)
Monocytes Relative: 9 %
NEUTROS ABS: 4851 {cells}/uL (ref 1500–7800)
Neutrophils Relative %: 63 %
PLATELETS: 299 10*3/uL (ref 140–400)
RBC: 5.25 MIL/uL (ref 4.20–5.80)
RDW: 14.1 % (ref 11.0–15.0)
WBC: 7.7 10*3/uL (ref 3.8–10.8)

## 2015-07-08 ENCOUNTER — Encounter: Payer: Self-pay | Admitting: Family Medicine

## 2015-07-10 ENCOUNTER — Encounter: Payer: Self-pay | Admitting: Internal Medicine

## 2016-02-25 ENCOUNTER — Encounter: Payer: Self-pay | Admitting: Family Medicine

## 2016-02-25 ENCOUNTER — Ambulatory Visit (INDEPENDENT_AMBULATORY_CARE_PROVIDER_SITE_OTHER): Payer: PRIVATE HEALTH INSURANCE | Admitting: Family Medicine

## 2016-02-25 VITALS — BP 152/104 | HR 84 | Temp 99.2°F | Resp 18 | Ht 71.0 in | Wt 226.0 lb

## 2016-02-25 DIAGNOSIS — G44209 Tension-type headache, unspecified, not intractable: Secondary | ICD-10-CM | POA: Diagnosis not present

## 2016-02-25 DIAGNOSIS — I10 Essential (primary) hypertension: Secondary | ICD-10-CM

## 2016-02-25 MED ORDER — TIZANIDINE HCL 4 MG PO TABS: 4.0000 mg | ORAL_TABLET | Freq: Three times a day (TID) | ORAL | 0 refills | Status: DC | PRN

## 2016-02-25 MED ORDER — AMLODIPINE BESYLATE 10 MG PO TABS: 10.0000 mg | ORAL_TABLET | Freq: Every day | ORAL | 3 refills | Status: DC

## 2016-02-25 NOTE — Progress Notes (Signed)
   Subjective:    Patient ID: Bryan LucksJames M Mose, male    DOB: 08/27/1965, 50 y.o.   MRN: [id number]  HPI Patient has been getting daily headaches for the last 4-6 weeks. Headache is worse in his forehead and around his eyes bilaterally. It is worse after a long day at work. He describes it as a pressure. He also reports feeling a vice like pain.  He does have a history of migraines. He has been getting occasional migraine headaches triggered by this other type of headache. Recently he begin checking his blood pressure and he is found his systolic blood pressure frequently runs between 140 and 150/90-100. Past Medical History:  Diagnosis Date  . Hypertension   . Kidney stone    No past surgical history on file. Current Outpatient Prescriptions on File Prior to Visit  Medication Sig Dispense Refill  . lisinopril (PRINIVIL,ZESTRIL) 40 MG tablet TAKE ONE TABLET BY MOUTH ONCE DAILY 90 tablet 4  . Multiple Vitamin (MULITIVITAMIN WITH MINERALS) TABS Take 1 tablet by mouth daily.     No current facility-administered medications on file prior to visit.    Allergies  Allergen Reactions  . Sulfonamide Derivatives Rash   Social History   Social History  . Marital status: Married    Spouse name: N/A  . Number of children: N/A  . Years of education: N/A   Occupational History  . Not on file.   Social History Main Topics  . Smoking status: Never Smoker  . Smokeless tobacco: Not on file  . Alcohol use No  . Drug use: No  . Sexual activity: Not on file   Other Topics Concern  . Not on file   Social History Narrative  . No narrative on file      Review of Systems  All other systems reviewed and are negative.      Objective:   Physical Exam  Constitutional: He is oriented to person, place, and time. He appears well-developed and well-nourished. No distress.  HENT:  Head: Normocephalic and atraumatic.  Right Ear: External ear normal.  Left Ear: External ear normal.  Nose: Nose  normal.  Mouth/Throat: No oropharyngeal exudate.  Eyes: Conjunctivae and EOM are normal. Pupils are equal, round, and reactive to light.  Cardiovascular: Normal rate, regular rhythm and normal heart sounds.   Pulmonary/Chest: Effort normal and breath sounds normal.  Neurological: He is alert and oriented to person, place, and time. He has normal reflexes. He displays normal reflexes. No cranial nerve deficit. He exhibits normal muscle tone. Coordination normal.  Skin: He is not diaphoretic.  Vitals reviewed.         Assessment & Plan:  Essential hypertension  Tension headache  I believe the patient's headache is due to tension headaches possibly underlying migraines as well, pain with elevated blood pressure. Begin Zanaflex 2-4 mg every 8 hours as needed for headache. Work on stress reduction at work and at home. Treat blood pressure with amlodipine 10 mg by mouth daily. Recheck in 2-3 weeks. If headaches persist, proceed with a CT scan of the sinuses to rule out chronic sinusitis given its location although he has no other symptoms. Denies symptoms of sleep apnea

## 2016-05-14 ENCOUNTER — Telehealth: Payer: Self-pay | Admitting: Family Medicine

## 2016-05-14 NOTE — Telephone Encounter (Signed)
[phone] Toniann FailWendy patients wife calling to say she would like to speak to you regarding some side effects he is having from the bp medication he is taking  [phone]

## 2016-05-15 NOTE — Telephone Encounter (Signed)
LMTRC

## 2016-05-22 NOTE — Telephone Encounter (Signed)
LMTRC

## 2016-08-03 ENCOUNTER — Other Ambulatory Visit: Payer: Self-pay | Admitting: Family Medicine

## 2016-08-03 DIAGNOSIS — I10 Essential (primary) hypertension: Secondary | ICD-10-CM

## 2017-02-02 ENCOUNTER — Encounter: Payer: Self-pay | Admitting: Family Medicine

## 2017-02-02 ENCOUNTER — Ambulatory Visit (INDEPENDENT_AMBULATORY_CARE_PROVIDER_SITE_OTHER): Payer: PRIVATE HEALTH INSURANCE | Admitting: Family Medicine

## 2017-02-02 ENCOUNTER — Telehealth: Payer: Self-pay | Admitting: Family Medicine

## 2017-02-02 VITALS — BP 144/98 | HR 86 | Temp 98.4°F | Resp 16 | Ht 71.0 in | Wt 227.0 lb

## 2017-02-02 DIAGNOSIS — I10 Essential (primary) hypertension: Secondary | ICD-10-CM

## 2017-02-02 DIAGNOSIS — Z125 Encounter for screening for malignant neoplasm of prostate: Secondary | ICD-10-CM | POA: Diagnosis not present

## 2017-02-02 LAB — CBC WITH DIFFERENTIAL/PLATELET
BASOS ABS: 57 {cells}/uL (ref 0–200)
Basophils Relative: 0.7 %
EOS PCT: 2.9 %
Eosinophils Absolute: 238 cells/uL (ref 15–500)
HEMATOCRIT: 49.5 % (ref 38.5–50.0)
Hemoglobin: 16.8 g/dL (ref 13.2–17.1)
LYMPHS ABS: 2001 {cells}/uL (ref 850–3900)
MCH: 30.4 pg (ref 27.0–33.0)
MCHC: 33.9 g/dL (ref 32.0–36.0)
MCV: 89.7 fL (ref 80.0–100.0)
MPV: 9.3 fL (ref 7.5–12.5)
Monocytes Relative: 8.8 %
Neutro Abs: 5182 cells/uL (ref 1500–7800)
Neutrophils Relative %: 63.2 %
PLATELETS: 323 10*3/uL (ref 140–400)
RBC: 5.52 10*6/uL (ref 4.20–5.80)
RDW: 12.5 % (ref 11.0–15.0)
TOTAL LYMPHOCYTE: 24.4 %
WBC: 8.2 10*3/uL (ref 3.8–10.8)
WBCMIX: 722 {cells}/uL (ref 200–950)

## 2017-02-02 LAB — LIPID PANEL
Cholesterol: 181 mg/dL (ref <200)
HDL: 43 mg/dL (ref >40)
LDL Cholesterol (Calc): 119 mg/dL (calc) — ABNORMAL HIGH
NON-HDL CHOLESTEROL (CALC): 138 mg/dL — AB (ref <130)
TRIGLYCERIDES: 88 mg/dL (ref <150)
Total CHOL/HDL Ratio: 4.2 (calc) (ref <5.0)

## 2017-02-02 LAB — COMPLETE METABOLIC PANEL WITH GFR
AG Ratio: 1.8 (calc) (ref 1.0–2.5)
ALT: 30 U/L (ref 9–46)
AST: 24 U/L (ref 10–35)
Albumin: 4.6 g/dL (ref 3.6–5.1)
Alkaline phosphatase (APISO): 98 U/L (ref 40–115)
BUN: 13 mg/dL (ref 7–25)
CALCIUM: 9.8 mg/dL (ref 8.6–10.3)
CO2: 30 mmol/L (ref 20–32)
CREATININE: 0.96 mg/dL (ref 0.70–1.33)
Chloride: 104 mmol/L (ref 98–110)
GFR, EST AFRICAN AMERICAN: 105 mL/min/{1.73_m2} (ref >=60)
GFR, EST NON AFRICAN AMERICAN: 91 mL/min/{1.73_m2} (ref >=60)
Globulin: 2.6 g/dL (calc) (ref 1.9–3.7)
Glucose, Bld: 103 mg/dL — ABNORMAL HIGH (ref 65–99)
POTASSIUM: 4.2 mmol/L (ref 3.5–5.3)
Sodium: 142 mmol/L (ref 135–146)
TOTAL PROTEIN: 7.2 g/dL (ref 6.1–8.1)
Total Bilirubin: 0.6 mg/dL (ref 0.2–1.2)

## 2017-02-02 LAB — PSA: PSA: 1.1 ng/mL (ref <=4.0)

## 2017-02-02 MED ORDER — LISINOPRIL 40 MG PO TABS: 40.0000 mg | ORAL_TABLET | Freq: Every day | ORAL | 1 refills | Status: DC

## 2017-02-02 MED ORDER — LISINOPRIL 40 MG PO TABS: 40.0000 mg | ORAL_TABLET | Freq: Every day | ORAL | 3 refills | Status: DC

## 2017-02-02 NOTE — Telephone Encounter (Signed)
Pt called stating he gave us the incorrect pharmacy it should be [facility], please call lisinopril to this pharmacy.

## 2017-02-02 NOTE — Telephone Encounter (Signed)
Patients wife wendy calling to ask questions about her husbands prescriptions  [phone] he was here today

## 2017-02-02 NOTE — Telephone Encounter (Signed)
Medication called/sent to requested pharmacy  

## 2017-02-02 NOTE — Telephone Encounter (Signed)
Needed med sent to different pharm for husband - med sent

## 2017-02-02 NOTE — Progress Notes (Signed)
Subjective:    Patient ID: Bryan Davenport, male    DOB: 1965/09/09, 51 y.o.   MRN: [id number]  HPI  02/2016 Patient has been getting daily headaches for the last 4-6 weeks. Headache is worse in his forehead and around his eyes bilaterally. It is worse after a long day at work. He describes it as a pressure. He also reports feeling a vice like pain.  He does have a history of migraines. He has been getting occasional migraine headaches triggered by this other type of headache. Recently he begin checking his blood pressure and he is found his systolic blood pressure frequently runs between 140 and 150/90-100.  At that time, my plan was: I believe the patient's headache is due to tension headaches possibly underlying migraines as well, pain with elevated blood pressure. Begin Zanaflex 2-4 mg every 8 hours as needed for headache. Work on stress reduction at work and at home. Treat blood pressure with amlodipine 10 mg by mouth daily. Recheck in 2-3 weeks. If headaches persist, proceed with a CT scan of the sinuses to rule out chronic sinusitis given its location although he has no other symptoms. Denies symptoms of sleep apnea  02/02/17 Patient never took the muscle relaxers for his headache.  He discontinued amlodipine due to erectile dysfunction.  He is here today because we would not refill his medication without an office visit.  Although pleasant, the patient does not follow-up with Dr.  He continues to suffer from frequent chronic headaches.  He has not been assessed for sleep apnea.  He does not take or tried Topamax for chronic headaches.  He is resistant to taking any new medication.  Therefore he declines further workup for the chronic headaches at this point.  However his blood pressure is elevated and is similarly elevated at home.  His blood pressure is not at goal.  He denies any chest pain shortness of breath or dyspnea on exertion Past Medical History:  Diagnosis Date  . Hypertension   .  Kidney stone    No past surgical history on file. Current Outpatient Medications on File Prior to Visit  Medication Sig Dispense Refill  . aspirin EC 81 MG tablet Take 81 mg by mouth daily.    Marland Kitchen. lisinopril (PRINIVIL,ZESTRIL) 40 MG tablet TAKE ONE TABLET BY MOUTH ONCE DAILY 90 tablet 1  . Multiple Vitamin (MULITIVITAMIN WITH MINERALS) TABS Take 1 tablet by mouth daily.    Marland Kitchen. amLODipine (NORVASC) 10 MG tablet Take 1 tablet (10 mg total) by mouth daily. (Patient not taking: Reported on 02/02/2017) 90 tablet 3   No current facility-administered medications on file prior to visit.    Allergies  Allergen Reactions  . Sulfonamide Derivatives Rash   Social History   Socioeconomic History  . Marital status: Married    Spouse name: Not on file  . Number of children: Not on file  . Years of education: Not on file  . Highest education level: Not on file  Social Needs  . Financial resource strain: Not on file  . Food insecurity - worry: Not on file  . Food insecurity - inability: Not on file  . Transportation needs - medical: Not on file  . Transportation needs - non-medical: Not on file  Occupational History  . Not on file  Tobacco Use  . Smoking status: Never Smoker  . Smokeless tobacco: Never Used  Substance and Sexual Activity  . Alcohol use: No  . Drug use: No  .  Sexual activity: Not on file  Other Topics Concern  . Not on file  Social History Narrative  . Not on file      Review of Systems  All other systems reviewed and are negative.      Objective:   Physical Exam  Constitutional: He is oriented to person, place, and time. He appears well-developed and well-nourished. No distress.  HENT:  Head: Normocephalic and atraumatic.  Right Ear: External ear normal.  Left Ear: External ear normal.  Nose: Nose normal.  Mouth/Throat: No oropharyngeal exudate.  Eyes: Conjunctivae and EOM are normal. Pupils are equal, round, and reactive to light.  Cardiovascular: Normal  rate, regular rhythm and normal heart sounds.  Pulmonary/Chest: Effort normal and breath sounds normal.  Neurological: He is alert and oriented to person, place, and time. He has normal reflexes. No cranial nerve deficit. He exhibits normal muscle tone. Coordination normal.  Skin: He is not diaphoretic.  Vitals reviewed.         Assessment & Plan:  Benign essential HTN - Plan: COMPLETE METABOLIC PANEL WITH GFR, Lipid panel, CBC with Differential/Platelet  Prostate cancer screening - Plan: PSA  I will check a CMP, fasting lipid panel, and CBC.  I will add Bystolic 5 mg a day to his current medication and recheck blood pressure in 1 month.  While checking lab work I will add a PSA.

## 2017-02-09 ENCOUNTER — Encounter: Payer: Self-pay | Admitting: Family Medicine

## 2017-02-10 ENCOUNTER — Ambulatory Visit: Payer: PRIVATE HEALTH INSURANCE | Admitting: Family Medicine

## 2017-02-10 VITALS — BP 130/80

## 2017-02-10 DIAGNOSIS — I10 Essential (primary) hypertension: Secondary | ICD-10-CM

## 2017-02-10 NOTE — Progress Notes (Signed)
Pt comes in c/o frontal HA thinks it may be his BP. BP good in office.

## 2017-02-16 ENCOUNTER — Telehealth: Payer: Self-pay | Admitting: Family Medicine

## 2017-02-16 MED ORDER — NEBIVOLOL HCL 5 MG PO TABS: 5.0000 mg | ORAL_TABLET | Freq: Every day | ORAL | 3 refills | Status: DC

## 2017-02-16 NOTE — Telephone Encounter (Signed)
Medication called/sent to requested pharmacy  

## 2017-02-16 NOTE — Telephone Encounter (Signed)
New prescription for bystolic 5 mg called into [facility] 90 day supply   CB# [phone]

## 2017-03-18 ENCOUNTER — Ambulatory Visit: Payer: PRIVATE HEALTH INSURANCE | Admitting: Family Medicine

## 2017-03-19 ENCOUNTER — Encounter: Payer: Self-pay | Admitting: Family Medicine

## 2017-03-19 ENCOUNTER — Ambulatory Visit (INDEPENDENT_AMBULATORY_CARE_PROVIDER_SITE_OTHER): Payer: 59 | Admitting: Family Medicine

## 2017-03-19 VITALS — BP 132/90 | HR 74 | Temp 99.1°F | Resp 14 | Ht 71.0 in | Wt 227.0 lb

## 2017-03-19 DIAGNOSIS — N50812 Left testicular pain: Secondary | ICD-10-CM

## 2017-03-19 NOTE — Progress Notes (Signed)
Subjective:    Patient ID: Bryan Davenport, male    DOB: May 02, 1965, 52 y.o.   MRN: [id number]  HPI Patient reports a one-month history of mild discomfort in his left testicle pain tends to be worse after prolonged standing or walking. The pain is better when he lies down. There are no exacerbating factors other than position and physical activity. There are no alleviating factors other than lying in the supine position. Patient wears boxers. He denies any dysuria, hematuria, urgency, frequency. He denies any back pain or CVA tenderness. He denies any fevers or chills or amount aspermia. On exam today, the left testicle is smooth and regular without tenderness or masses. There is no tenderness or pain in his epididymis. There is no palpable abnormality such as a varicocele or cystocele. There is no inguinal hernia or lymphadenopathy. Past Medical History:  Diagnosis Date  . Hypertension   . Kidney stone    No past surgical history on file. Current Outpatient Medications on File Prior to Visit  Medication Sig Dispense Refill  . aspirin EC 81 MG tablet Take 81 mg by mouth daily.    Marland Kitchen. lisinopril (PRINIVIL,ZESTRIL) 40 MG tablet Take 1 tablet (40 mg total) by mouth daily. 90 tablet 3  . Multiple Vitamin (MULITIVITAMIN WITH MINERALS) TABS Take 1 tablet by mouth daily.    . nebivolol (BYSTOLIC) 5 MG tablet Take 1 tablet (5 mg total) by mouth daily. 90 tablet 3   No current facility-administered medications on file prior to visit.    Allergies  Allergen Reactions  . Sulfonamide Derivatives Rash   Social History   Socioeconomic History  . Marital status: Married    Spouse name: Not on file  . Number of children: Not on file  . Years of education: Not on file  . Highest education level: Not on file  Social Needs  . Financial resource strain: Not on file  . Food insecurity - worry: Not on file  . Food insecurity - inability: Not on file  . Transportation needs - medical: Not on file  .  Transportation needs - non-medical: Not on file  Occupational History  . Not on file  Tobacco Use  . Smoking status: Never Smoker  . Smokeless tobacco: Never Used  Substance and Sexual Activity  . Alcohol use: No  . Drug use: No  . Sexual activity: Not on file  Other Topics Concern  . Not on file  Social History Narrative  . Not on file      Review of Systems  All other systems reviewed and are negative.      Objective:   Physical Exam  Cardiovascular: Normal rate, regular rhythm and normal heart sounds.  Pulmonary/Chest: Effort normal and breath sounds normal. No respiratory distress. He has no wheezes. He has no rales.  Abdominal: Soft. Bowel sounds are normal. He exhibits no distension. There is no tenderness. There is no rebound and no guarding. Hernia confirmed negative in the right inguinal area and confirmed negative in the left inguinal area.  Genitourinary: Testes normal and penis normal. Right testis shows no mass, no swelling and no tenderness. Right testis is descended. Cremasteric reflex is not absent on the right side. Left testis shows no mass, no swelling and no tenderness. Left testis is descended. Cremasteric reflex is not absent on the left side.  Lymphadenopathy:       Right: No inguinal adenopathy present.       Left: No inguinal adenopathy present.  Vitals reviewed.         Assessment & Plan:  Left testicular pain - Plan: US Scrotum  I believe the cause of his pain is benign and likely due to wearing unsupportive undergarments and physical activity. I recommended that he switch to briefs or more supportive undergarments and use ibuprofen 800 mg every 8 hours over the next week and I anticipate symptoms will gradually improve over a period of 1-2 weeks. I do not palpate any masses in the testicle or evidence of orchitis or epididymitis. I will obtain an ultrasound of the scrotum to rule out any structural abnormalities. If pain worsens he is to call me  immediately. Patient is comfortable with this plan

## 2017-03-29 ENCOUNTER — Other Ambulatory Visit: Payer: Self-pay

## 2017-05-18 ENCOUNTER — Encounter: Payer: Self-pay | Admitting: Family Medicine

## 2017-06-15 ENCOUNTER — Encounter: Payer: Self-pay | Admitting: Family Medicine

## 2017-10-12 ENCOUNTER — Ambulatory Visit (INDEPENDENT_AMBULATORY_CARE_PROVIDER_SITE_OTHER): Payer: 59 | Admitting: Family Medicine

## 2017-10-12 ENCOUNTER — Encounter: Payer: Self-pay | Admitting: Family Medicine

## 2017-10-12 VITALS — BP 140/90 | HR 90 | Temp 98.4°F | Resp 18 | Ht 71.0 in | Wt 228.0 lb

## 2017-10-12 DIAGNOSIS — I10 Essential (primary) hypertension: Secondary | ICD-10-CM

## 2017-10-12 MED ORDER — HYDROCHLOROTHIAZIDE 25 MG PO TABS: 25.0000 mg | ORAL_TABLET | Freq: Every day | ORAL | 1 refills | Status: DC

## 2017-10-12 NOTE — Progress Notes (Signed)
Subjective:    Patient ID: Bryan Davenport, male    DOB: 08/31/1965, 52 y.o.   MRN: [id number]  Medication Refill     02/2016 Patient has been getting daily headaches for the last 4-6 weeks. Headache is worse in his forehead and around his eyes bilaterally. It is worse after a long day at work. He describes it as a pressure. He also reports feeling a vice like pain.  He does have a history of migraines. He has been getting occasional migraine headaches triggered by this other type of headache. Recently he begin checking his blood pressure and he is found his systolic blood pressure frequently runs between 140 and 150/90-100.  At that time, my plan was: I believe the patient's headache is due to tension headaches possibly underlying migraines as well, pain with elevated blood pressure. Begin Zanaflex 2-4 mg every 8 hours as needed for headache. Work on stress reduction at work and at home. Treat blood pressure with amlodipine 10 mg by mouth daily. Recheck in 2-3 weeks. If headaches persist, proceed with a CT scan of the sinuses to rule out chronic sinusitis given its location although he has no other symptoms. Denies symptoms of sleep apnea  02/02/17 Patient never took the muscle relaxers for his headache.  He discontinued amlodipine due to erectile dysfunction.  He is here today because we would not refill his medication without an office visit.  Although pleasant, the patient does not follow-up with Dr.  He continues to suffer from frequent chronic headaches.  He has not been assessed for sleep apnea.  He does not take or tried Topamax for chronic headaches.  He is resistant to taking any new medication.  Therefore he declines further workup for the chronic headaches at this point.  However his blood pressure is elevated and is similarly elevated at home.  His blood pressure is not at goal.  He denies any chest pain shortness of breath or dyspnea on exertion.  AT that time, my plan was: I will check a  CMP, fasting lipid panel, and CBC.  I will add Bystolic 5 mg a day to his current medication and recheck blood pressure in 1 month.  While checking lab work I will add a PSA.  10/12/17 Patient is here today with his wife.  He is concerned based on a myriad of symptoms he is having that he is not tolerating the Bystolic.  He believes the medication is giving him the symptoms.  Symptoms include numbness and tingling in his hands.  He also has occasional numbness in his feet when he first wakes up he gets better as he walks on them.  He also reports fatigue, erectile dysfunction.  I do not believe the Bystolic is causing any of the paresthesias in his extremities.  He can certainly possibly could be causing the fatigue and contributing to the erectile dysfunction however explained to the patient erectile dysfunction is likely to be a side effect of any number of medications to lower blood pressure.  He also does not like having to take 2 medications a day.  He would like to switch to 1 and preferably one that is cheaper. Past Medical History:  Diagnosis Date  . Hypertension   . Kidney stone    No past surgical history on file. Current Outpatient Medications on File Prior to Visit  Medication Sig Dispense Refill  . lisinopril (PRINIVIL,ZESTRIL) 40 MG tablet Take 1 tablet (40 mg total) by mouth daily. 90 tablet  3  . Multiple Vitamin (MULITIVITAMIN WITH MINERALS) TABS Take 1 tablet by mouth daily.    Marland Kitchen aspirin EC 81 MG tablet Take 81 mg by mouth daily.     No current facility-administered medications on file prior to visit.    Allergies  Allergen Reactions  . Sulfonamide Derivatives Rash   Social History   Socioeconomic History  . Marital status: Married    Spouse name: Not on file  . Number of children: Not on file  . Years of education: Not on file  . Highest education level: Not on file  Occupational History  . Not on file  Social Needs  . Financial resource strain: Not on file  . Food  insecurity:    Worry: Not on file    Inability: Not on file  . Transportation needs:    Medical: Not on file    Non-medical: Not on file  Tobacco Use  . Smoking status: Never Smoker  . Smokeless tobacco: Never Used  Substance and Sexual Activity  . Alcohol use: No  . Drug use: No  . Sexual activity: Not on file  Lifestyle  . Physical activity:    Days per week: Not on file    Minutes per session: Not on file  . Stress: Not on file  Relationships  . Social connections:    Talks on phone: Not on file    Gets together: Not on file    Attends religious service: Not on file    Active member of club or organization: Not on file    Attends meetings of clubs or organizations: Not on file    Relationship status: Not on file  . Intimate partner violence:    Fear of current or ex partner: Not on file    Emotionally abused: Not on file    Physically abused: Not on file    Forced sexual activity: Not on file  Other Topics Concern  . Not on file  Social History Narrative  . Not on file      Review of Systems  All other systems reviewed and are negative.      Objective:   Physical Exam  Constitutional: He is oriented to person, place, and time. He appears well-developed and well-nourished. No distress.  HENT:  Head: Normocephalic and atraumatic.  Right Ear: External ear normal.  Left Ear: External ear normal.  Nose: Nose normal.  Mouth/Throat: No oropharyngeal exudate.  Eyes: Pupils are equal, round, and reactive to light. Conjunctivae and EOM are normal.  Cardiovascular: Normal rate, regular rhythm and normal heart sounds.  Pulmonary/Chest: Effort normal and breath sounds normal.  Neurological: He is alert and oriented to person, place, and time. He has normal reflexes. No cranial nerve deficit. He exhibits normal muscle tone. Coordination normal.  Skin: He is not diaphoretic.  Vitals reviewed.         Assessment & Plan:  Benign essential HTN  Discontinue  Bystolic.  Continue lisinopril.  Add hydrochlorothiazide 25 mg a day.  Recheck blood pressure in 1 month.  Patient is due for fasting lab work in November.

## 2017-12-03 ENCOUNTER — Other Ambulatory Visit: Payer: Self-pay | Admitting: Family Medicine

## 2017-12-13 ENCOUNTER — Telehealth: Payer: Self-pay | Admitting: Family Medicine

## 2017-12-13 DIAGNOSIS — I10 Essential (primary) hypertension: Secondary | ICD-10-CM

## 2017-12-13 MED ORDER — HYDROCHLOROTHIAZIDE 25 MG PO TABS: 25.0000 mg | ORAL_TABLET | Freq: Every day | ORAL | 3 refills | Status: DC

## 2017-12-13 MED ORDER — LISINOPRIL 40 MG PO TABS: 40.0000 mg | ORAL_TABLET | Freq: Every day | ORAL | 3 refills | Status: DC

## 2017-12-13 NOTE — Telephone Encounter (Signed)
Medication called/sent to requested pharmacy  

## 2017-12-13 NOTE — Telephone Encounter (Signed)
Pt needs refill on HTCZ and lisinopril to [facility] [address].

## 2017-12-21 ENCOUNTER — Ambulatory Visit (INDEPENDENT_AMBULATORY_CARE_PROVIDER_SITE_OTHER): Payer: 59 | Admitting: Family Medicine

## 2017-12-21 ENCOUNTER — Encounter: Payer: Self-pay | Admitting: Family Medicine

## 2017-12-21 VITALS — BP 110/78 | HR 78 | Temp 98.1°F | Resp 16 | Ht 71.0 in | Wt 226.0 lb

## 2017-12-21 DIAGNOSIS — I10 Essential (primary) hypertension: Secondary | ICD-10-CM | POA: Diagnosis not present

## 2017-12-21 DIAGNOSIS — Z125 Encounter for screening for malignant neoplasm of prostate: Secondary | ICD-10-CM | POA: Diagnosis not present

## 2017-12-21 LAB — CBC WITH DIFFERENTIAL/PLATELET
Basophils Absolute: 48 cells/uL (ref 0–200)
Basophils Relative: 0.6 %
EOS PCT: 2.1 %
Eosinophils Absolute: 168 cells/uL (ref 15–500)
HEMATOCRIT: 45.8 % (ref 38.5–50.0)
HEMOGLOBIN: 15.5 g/dL (ref 13.2–17.1)
LYMPHS ABS: 1904 {cells}/uL (ref 850–3900)
MCH: 30.7 pg (ref 27.0–33.0)
MCHC: 33.8 g/dL (ref 32.0–36.0)
MCV: 90.7 fL (ref 80.0–100.0)
MPV: 9.8 fL (ref 7.5–12.5)
Monocytes Relative: 10 %
NEUTROS ABS: 5080 {cells}/uL (ref 1500–7800)
Neutrophils Relative %: 63.5 %
Platelets: 310 10*3/uL (ref 140–400)
RBC: 5.05 10*6/uL (ref 4.20–5.80)
RDW: 12.5 % (ref 11.0–15.0)
Total Lymphocyte: 23.8 %
WBC mixed population: 800 cells/uL (ref 200–950)
WBC: 8 10*3/uL (ref 3.8–10.8)

## 2017-12-21 LAB — COMPLETE METABOLIC PANEL WITH GFR
AG Ratio: 1.7 (calc) (ref 1.0–2.5)
ALBUMIN MSPROF: 4.4 g/dL (ref 3.6–5.1)
ALKALINE PHOSPHATASE (APISO): 91 U/L (ref 40–115)
ALT: 27 U/L (ref 9–46)
AST: 23 U/L (ref 10–35)
BILIRUBIN TOTAL: 0.5 mg/dL (ref 0.2–1.2)
BUN: 17 mg/dL (ref 7–25)
CO2: 28 mmol/L (ref 20–32)
Calcium: 9.8 mg/dL (ref 8.6–10.3)
Chloride: 100 mmol/L (ref 98–110)
Creat: 1.15 mg/dL (ref 0.70–1.33)
GFR, EST AFRICAN AMERICAN: 84 mL/min/{1.73_m2} (ref >=60)
GFR, Est Non African American: 72 mL/min/{1.73_m2} (ref >=60)
GLOBULIN: 2.6 g/dL (ref 1.9–3.7)
Glucose, Bld: 94 mg/dL (ref 65–99)
Potassium: 4.2 mmol/L (ref 3.5–5.3)
SODIUM: 140 mmol/L (ref 135–146)
TOTAL PROTEIN: 7 g/dL (ref 6.1–8.1)

## 2017-12-21 LAB — LIPID PANEL
CHOLESTEROL: 176 mg/dL (ref <200)
HDL: 37 mg/dL — ABNORMAL LOW (ref >40)
LDL CHOLESTEROL (CALC): 116 mg/dL — AB
Non-HDL Cholesterol (Calc): 139 mg/dL (calc) — ABNORMAL HIGH (ref <130)
TRIGLYCERIDES: 120 mg/dL (ref <150)
Total CHOL/HDL Ratio: 4.8 (calc) (ref <5.0)

## 2017-12-21 LAB — PSA: PSA: 1.7 ng/mL (ref <=4.0)

## 2017-12-21 NOTE — Progress Notes (Signed)
Subjective:    Patient ID: Bryan Davenport, male    DOB: Mar 21, 1965, 52 y.o.   MRN: [id number]  Medication Refill     At the patient's last visit, hydrochlorothiazide 25 mg a day was added to lisinopril 40 mg a day due to his elevated blood pressure.  Since that time his blood pressure has been 110-120 over 70s.  He reports occasional orthostatic dizziness.  However he denies any chest pain shortness of breath or dyspnea on exertion.  He is due for his annual PSA as well as fasting lab work to monitor his cholesterol.  He has never had a colonoscopy.  He refuses a colonoscopy but he would consent to Cologuard.  We discussed this in detail including the risk and benefits of the screening test and he elects to proceed with a Cologuard.  He is due for a flu shot which he declines.  He is also due for an HIV test which he declines.  Review of systems is only significant for decreased libido and fatigue however he has no interest in testosterone replacement therapy Past Medical History:  Diagnosis Date  . Hypertension   . Kidney stone    No past surgical history on file. Current Outpatient Medications on File Prior to Visit  Medication Sig Dispense Refill  . aspirin EC 81 MG tablet Take 81 mg by mouth daily.    . hydrochlorothiazide (HYDRODIURIL) 25 MG tablet Take 1 tablet (25 mg total) by mouth daily. 30 tablet 1  . lisinopril (PRINIVIL,ZESTRIL) 40 MG tablet Take 1 tablet (40 mg total) by mouth daily. 90 tablet 3  . Multiple Vitamin (MULITIVITAMIN WITH MINERALS) TABS Take 1 tablet by mouth daily.     No current facility-administered medications on file prior to visit.    Allergies  Allergen Reactions  . Sulfonamide Derivatives Rash   Social History   Socioeconomic History  . Marital status: Married    Spouse name: Not on file  . Number of children: Not on file  . Years of education: Not on file  . Highest education level: Not on file  Occupational History  . Not on file  Social  Needs  . Financial resource strain: Not on file  . Food insecurity:    Worry: Not on file    Inability: Not on file  . Transportation needs:    Medical: Not on file    Non-medical: Not on file  Tobacco Use  . Smoking status: Never Smoker  . Smokeless tobacco: Never Used  Substance and Sexual Activity  . Alcohol use: No  . Drug use: No  . Sexual activity: Not on file  Lifestyle  . Physical activity:    Days per week: Not on file    Minutes per session: Not on file  . Stress: Not on file  Relationships  . Social connections:    Talks on phone: Not on file    Gets together: Not on file    Attends religious service: Not on file    Active member of club or organization: Not on file    Attends meetings of clubs or organizations: Not on file    Relationship status: Not on file  . Intimate partner violence:    Fear of current or ex partner: Not on file    Emotionally abused: Not on file    Physically abused: Not on file    Forced sexual activity: Not on file  Other Topics Concern  . Not on file  Social History Narrative  . Not on file      Review of Systems  All other systems reviewed and are negative.      Objective:   Physical Exam  Constitutional: He is oriented to person, place, and time. He appears well-developed and well-nourished. No distress.  HENT:  Head: Normocephalic and atraumatic.  Right Ear: External ear normal.  Left Ear: External ear normal.  Nose: Nose normal.  Mouth/Throat: No oropharyngeal exudate.  Eyes: Pupils are equal, round, and reactive to light. Conjunctivae and EOM are normal.  Neck: Neck supple. No JVD present. No thyromegaly present.  Cardiovascular: Normal rate, regular rhythm and normal heart sounds. Exam reveals no gallop and no friction rub.  No murmur heard. Pulmonary/Chest: Effort normal and breath sounds normal. No respiratory distress. He has no wheezes. He has no rales. He exhibits no tenderness.  Abdominal: Soft. Bowel sounds  are normal. He exhibits no distension and no mass. There is no tenderness. There is no rebound and no guarding.  Lymphadenopathy:    He has no cervical adenopathy.  Neurological: He is alert and oriented to person, place, and time. He has normal reflexes. No cranial nerve deficit. He exhibits normal muscle tone. Coordination normal.  Skin: He is not diaphoretic.  Vitals reviewed.         Assessment & Plan:  Benign essential HTN - Plan: CBC with Differential/Platelet, Lipid panel, COMPLETE METABOLIC PANEL WITH GFR  Prostate cancer screening - Plan: PSA  Blood pressures acceptable.  However I would discontinue hydrochlorothiazide to 1/2 tablet a day to avoid orthostatic dizziness.  Continue lisinopril at 40 mg a day.  Meanwhile check CBC, CMP, fasting lipid panel.  While checking fasting lab work I will also screen the patient for prostate cancer.  I will register the patient for Cologuard.  He declines HIV screening.  He declines a flu shot.

## 2017-12-29 ENCOUNTER — Encounter: Payer: Self-pay | Admitting: Family Medicine

## 2018-01-26 ENCOUNTER — Other Ambulatory Visit: Payer: Self-pay | Admitting: Family Medicine

## 2018-01-26 DIAGNOSIS — I10 Essential (primary) hypertension: Secondary | ICD-10-CM

## 2018-01-26 MED ORDER — LISINOPRIL 40 MG PO TABS: 40.0000 mg | ORAL_TABLET | Freq: Every day | ORAL | 3 refills | Status: DC

## 2019-01-15 ENCOUNTER — Other Ambulatory Visit: Payer: Self-pay | Admitting: Family Medicine

## 2019-02-23 ENCOUNTER — Other Ambulatory Visit: Payer: Self-pay | Admitting: Family Medicine

## 2019-02-23 DIAGNOSIS — I10 Essential (primary) hypertension: Secondary | ICD-10-CM

## 2019-03-20 ENCOUNTER — Other Ambulatory Visit: Payer: Self-pay | Admitting: Family Medicine

## 2019-03-20 DIAGNOSIS — I10 Essential (primary) hypertension: Secondary | ICD-10-CM

## 2019-04-12 ENCOUNTER — Other Ambulatory Visit: Payer: Self-pay | Admitting: Family Medicine

## 2019-04-12 DIAGNOSIS — I10 Essential (primary) hypertension: Secondary | ICD-10-CM

## 2019-04-24 ENCOUNTER — Ambulatory Visit (INDEPENDENT_AMBULATORY_CARE_PROVIDER_SITE_OTHER): Payer: 59 | Admitting: Family Medicine

## 2019-04-24 ENCOUNTER — Other Ambulatory Visit: Payer: Self-pay

## 2019-04-24 ENCOUNTER — Telehealth: Payer: Self-pay | Admitting: *Deleted

## 2019-04-24 VITALS — BP 132/80 | HR 96 | Temp 97.8°F | Resp 14 | Ht 71.0 in | Wt 220.0 lb

## 2019-04-24 DIAGNOSIS — I10 Essential (primary) hypertension: Secondary | ICD-10-CM

## 2019-04-24 DIAGNOSIS — Z125 Encounter for screening for malignant neoplasm of prostate: Secondary | ICD-10-CM | POA: Diagnosis not present

## 2019-04-24 MED ORDER — SILDENAFIL CITRATE 100 MG PO TABS: 50.0000 mg | ORAL_TABLET | Freq: Every day | ORAL | 11 refills | Status: DC | PRN

## 2019-04-24 NOTE — Progress Notes (Signed)
Subjective:    Patient ID: Bryan Davenport, male    DOB: 1965/11/07, 54 y.o.   MRN: [id number]  Medication Refill    Patient is a very pleasant 54 year old Caucasian male here today for recheck.  He has not been taking hydrochlorothiazide as prescribed.  He is only taking one third of a tablet daily.  He is taking lisinopril 40 mg a day.  Despite only taking one third of a hydrochlorothiazide tablet, his blood pressure today is well controlled at 132/80.  Therefore I question if he even needs the hydrochlorothiazide at all.  He denies any chest pain shortness of breath or dyspnea on exertion.  He does report some occasional erectile dysfunction and decreased libido.  Harder to get or maintain an erection.   He is due for a colonoscopy.  We scheduled him for a Cologuard last year however he never received the package in the mail.  He is willing to perform this test again this year.  He is also due for prostate cancer screening with PSA.   Past Medical History:  Diagnosis Date  . Hypertension   . Kidney stone    No past surgical history on file. Current Outpatient Medications on File Prior to Visit  Medication Sig Dispense Refill  . hydrochlorothiazide (HYDRODIURIL) 25 MG tablet TAKE 1 TABLET BY MOUTH EVERY DAY (Patient taking differently: Takes 1/3 of a tablet) 90 tablet 3  . lisinopril (ZESTRIL) 40 MG tablet TAKE 1 TABLET BY MOUTH EVERY DAY 30 tablet 0  . Multiple Vitamin (MULITIVITAMIN WITH MINERALS) TABS Take 1 tablet by mouth daily.     No current facility-administered medications on file prior to visit.   Allergies  Allergen Reactions  . Sulfonamide Derivatives Rash   Social History   Socioeconomic History  . Marital status: Married    Spouse name: Not on file  . Number of children: Not on file  . Years of education: Not on file  . Highest education level: Not on file  Occupational History  . Not on file  Tobacco Use  . Smoking status: Never Smoker  . Smokeless tobacco:  Never Used  Substance and Sexual Activity  . Alcohol use: No  . Drug use: No  . Sexual activity: Not on file  Other Topics Concern  . Not on file  Social History Narrative  . Not on file   Social Determinants of Health   Financial Resource Strain:   . Difficulty of Paying Living Expenses: Not on file  Food Insecurity:   . Worried About Programme researcher, broadcasting/film/video in the Last Year: Not on file  . Ran Out of Food in the Last Year: Not on file  Transportation Needs:   . Lack of Transportation (Medical): Not on file  . Lack of Transportation (Non-Medical): Not on file  Physical Activity:   . Days of Exercise per Week: Not on file  . Minutes of Exercise per Session: Not on file  Stress:   . Feeling of Stress : Not on file  Social Connections:   . Frequency of Communication with Friends and Family: Not on file  . Frequency of Social Gatherings with Friends and Family: Not on file  . Attends Religious Services: Not on file  . Active Member of Clubs or Organizations: Not on file  . Attends Banker Meetings: Not on file  . Marital Status: Not on file  Intimate Partner Violence:   . Fear of Current or Ex-Partner: Not on file  .  Emotionally Abused: Not on file  . Physically Abused: Not on file  . Sexually Abused: Not on file      Review of Systems  All other systems reviewed and are negative.      Objective:   Physical Exam  Constitutional: He is oriented to person, place, and time. He appears well-developed and well-nourished. No distress.  HENT:  Head: Normocephalic and atraumatic.  Right Ear: External ear normal.  Left Ear: External ear normal.  Nose: Nose normal.  Mouth/Throat: No oropharyngeal exudate.  Eyes: Pupils are equal, round, and reactive to light. Conjunctivae and EOM are normal.  Neck: No JVD present. No thyromegaly present.  Cardiovascular: Normal rate, regular rhythm and normal heart sounds. Exam reveals no gallop and no friction rub.  No murmur  heard. Pulmonary/Chest: Effort normal and breath sounds normal. No respiratory distress. He has no wheezes. He has no rales. He exhibits no tenderness.  Abdominal: Soft. Bowel sounds are normal. He exhibits no distension and no mass. There is no abdominal tenderness. There is no rebound and no guarding.  Musculoskeletal:     Cervical back: Neck supple.  Lymphadenopathy:    He has no cervical adenopathy.  Neurological: He is alert and oriented to person, place, and time. He has normal reflexes. No cranial nerve deficit. He exhibits normal muscle tone. Coordination normal.  Skin: He is not diaphoretic.  Vitals reviewed.         Assessment & Plan:  Benign essential HTN - Plan: CBC with Differential/Platelet, COMPLETE METABOLIC PANEL WITH GFR, Lipid panel  Prostate cancer screening - Plan: PSA  Screen for prostate cancer with a PSA.  Blood pressure today is outstanding.  I think he can stop the hydrochlorothiazide.  If he consistently sees systolic blood pressure greater than 140 I would resume the hydrochlorothiazide at 12.5 mg a day.  The patient is having frequent daily headaches which sound like tension headaches.  We discussed trying Topamax as a preventative.  However at the present time the patient is concerned about side effects and elects not to do this.  I will screen for colon cancer with Cologuard.Gave him viagra 50-100 mg poqday prn ED.

## 2019-04-24 NOTE — Telephone Encounter (Signed)
-----   Message from Ricard Dillon, [place] sent at 04/24/2019 12:39 PM EST -----  ----- Message ----- From: Donita Brooks, MD Sent: 04/24/2019  12:09 PM EST To: Ricard Dillon, RMA  Please schedule cologuard

## 2019-04-24 NOTE — Telephone Encounter (Signed)
Received verbal orders for Cologuard.   Order placed via [employer].   Cologuard (Order [id number])

## 2019-04-25 LAB — CBC WITH DIFFERENTIAL/PLATELET
Absolute Monocytes: 808 cells/uL (ref 200–950)
Basophils Absolute: 51 cells/uL (ref 0–200)
Basophils Relative: 0.6 %
Eosinophils Absolute: 94 cells/uL (ref 15–500)
Eosinophils Relative: 1.1 %
HCT: 49.1 % (ref 38.5–50.0)
Hemoglobin: 16.6 g/dL (ref 13.2–17.1)
Lymphs Abs: 1989 cells/uL (ref 850–3900)
MCH: 30.5 pg (ref 27.0–33.0)
MCHC: 33.8 g/dL (ref 32.0–36.0)
MCV: 90.3 fL (ref 80.0–100.0)
MPV: 9.5 fL (ref 7.5–12.5)
Monocytes Relative: 9.5 %
Neutro Abs: 5559 cells/uL (ref 1500–7800)
Neutrophils Relative %: 65.4 %
Platelets: 338 10*3/uL (ref 140–400)
RBC: 5.44 10*6/uL (ref 4.20–5.80)
RDW: 12.6 % (ref 11.0–15.0)
Total Lymphocyte: 23.4 %
WBC: 8.5 10*3/uL (ref 3.8–10.8)

## 2019-04-25 LAB — COMPLETE METABOLIC PANEL WITH GFR
AG Ratio: 2 (calc) (ref 1.0–2.5)
ALT: 32 U/L (ref 9–46)
AST: 23 U/L (ref 10–35)
Albumin: 4.7 g/dL (ref 3.6–5.1)
Alkaline phosphatase (APISO): 93 U/L (ref 35–144)
BUN: 18 mg/dL (ref 7–25)
CO2: 31 mmol/L (ref 20–32)
Calcium: 10 mg/dL (ref 8.6–10.3)
Chloride: 101 mmol/L (ref 98–110)
Creat: 1.15 mg/dL (ref 0.70–1.33)
GFR, Est African American: 83 mL/min/{1.73_m2} (ref >=60)
GFR, Est Non African American: 72 mL/min/{1.73_m2} (ref >=60)
Globulin: 2.4 g/dL (calc) (ref 1.9–3.7)
Glucose, Bld: 96 mg/dL (ref 65–99)
Potassium: 4.5 mmol/L (ref 3.5–5.3)
Sodium: 140 mmol/L (ref 135–146)
Total Bilirubin: 0.5 mg/dL (ref 0.2–1.2)
Total Protein: 7.1 g/dL (ref 6.1–8.1)

## 2019-04-25 LAB — LIPID PANEL
Cholesterol: 188 mg/dL (ref <200)
HDL: 43 mg/dL (ref >=40)
LDL Cholesterol (Calc): 127 mg/dL (calc) — ABNORMAL HIGH
Non-HDL Cholesterol (Calc): 145 mg/dL (calc) — ABNORMAL HIGH (ref <130)
Total CHOL/HDL Ratio: 4.4 (calc) (ref <5.0)
Triglycerides: 83 mg/dL (ref <150)

## 2019-04-25 LAB — PSA: PSA: 0.8 ng/mL (ref <=4.0)

## 2019-04-26 ENCOUNTER — Encounter: Payer: Self-pay | Admitting: Family Medicine

## 2019-05-05 ENCOUNTER — Other Ambulatory Visit: Payer: Self-pay | Admitting: Family Medicine

## 2019-05-05 DIAGNOSIS — I10 Essential (primary) hypertension: Secondary | ICD-10-CM

## 2019-05-21 ENCOUNTER — Encounter: Payer: Self-pay | Admitting: Family Medicine

## 2019-05-21 LAB — COLOGUARD

## 2019-05-29 ENCOUNTER — Other Ambulatory Visit: Payer: Self-pay | Admitting: Family Medicine

## 2019-05-29 DIAGNOSIS — I10 Essential (primary) hypertension: Secondary | ICD-10-CM

## 2019-05-31 LAB — COLOGUARD: COLOGUARD: NEGATIVE

## 2019-06-02 NOTE — Telephone Encounter (Signed)
Received the results of Cologuard screening.   Screening noted negative.   A negative result indicates a low likelihood of colorectal cancer is present. Following a negative Cologuard result, the American Cancer Society recommends a Cologuard re-screening interval of 3 years.   Letter sent.   

## 2019-06-23 ENCOUNTER — Other Ambulatory Visit: Payer: Self-pay | Admitting: Family Medicine

## 2019-06-23 DIAGNOSIS — I10 Essential (primary) hypertension: Secondary | ICD-10-CM

## 2019-07-17 ENCOUNTER — Other Ambulatory Visit: Payer: Self-pay | Admitting: Family Medicine

## 2019-07-17 DIAGNOSIS — I10 Essential (primary) hypertension: Secondary | ICD-10-CM

## 2020-04-11 ENCOUNTER — Ambulatory Visit (INDEPENDENT_AMBULATORY_CARE_PROVIDER_SITE_OTHER): Payer: 59 | Admitting: Family Medicine

## 2020-04-11 ENCOUNTER — Other Ambulatory Visit: Payer: Self-pay

## 2020-04-11 VITALS — BP 130/92 | HR 98 | Temp 98.5°F | Ht 71.0 in | Wt 232.0 lb

## 2020-04-11 DIAGNOSIS — Z125 Encounter for screening for malignant neoplasm of prostate: Secondary | ICD-10-CM

## 2020-04-11 DIAGNOSIS — G43709 Chronic migraine without aura, not intractable, without status migrainosus: Secondary | ICD-10-CM

## 2020-04-11 DIAGNOSIS — I10 Essential (primary) hypertension: Secondary | ICD-10-CM | POA: Diagnosis not present

## 2020-04-11 MED ORDER — NURTEC 75 MG PO TBDP: 1.0000 | ORAL_TABLET | ORAL | 5 refills | Status: DC

## 2020-04-11 MED ORDER — LISINOPRIL 40 MG PO TABS: ORAL_TABLET | ORAL | 3 refills | Status: DC
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
## 2020-04-12 LAB — COMPLETE METABOLIC PANEL WITH GFR
AG Ratio: 1.9 (calc) (ref 1.0–2.5)
ALT: 31 U/L (ref 9–46)
AST: 21 U/L (ref 10–35)
Albumin: 4.3 g/dL (ref 3.6–5.1)
Alkaline phosphatase (APISO): 97 U/L (ref 35–144)
BUN: 14 mg/dL (ref 7–25)
CO2: 23 mmol/L (ref 20–32)
Calcium: 9.6 mg/dL (ref 8.6–10.3)
Chloride: 107 mmol/L (ref 98–110)
Creat: 0.96 mg/dL (ref 0.70–1.33)
GFR, Est African American: 103 mL/min/{1.73_m2} (ref >=60)
GFR, Est Non African American: 89 mL/min/{1.73_m2} (ref >=60)
Globulin: 2.3 g/dL (calc) (ref 1.9–3.7)
Glucose, Bld: 90 mg/dL (ref 65–99)
Potassium: 4 mmol/L (ref 3.5–5.3)
Sodium: 144 mmol/L (ref 135–146)
Total Bilirubin: 0.4 mg/dL (ref 0.2–1.2)
Total Protein: 6.6 g/dL (ref 6.1–8.1)

## 2020-04-12 LAB — CBC WITH DIFFERENTIAL/PLATELET
Absolute Monocytes: 1156 cells/uL — ABNORMAL HIGH (ref 200–950)
Basophils Absolute: 47 cells/uL (ref 0–200)
Basophils Relative: 0.5 %
Eosinophils Absolute: 197 cells/uL (ref 15–500)
Eosinophils Relative: 2.1 %
HCT: 46.6 % (ref 38.5–50.0)
Hemoglobin: 16.1 g/dL (ref 13.2–17.1)
Lymphs Abs: 2444 cells/uL (ref 850–3900)
MCH: 31.3 pg (ref 27.0–33.0)
MCHC: 34.5 g/dL (ref 32.0–36.0)
MCV: 90.5 fL (ref 80.0–100.0)
MPV: 9.6 fL (ref 7.5–12.5)
Monocytes Relative: 12.3 %
Neutro Abs: 5555 cells/uL (ref 1500–7800)
Neutrophils Relative %: 59.1 %
Platelets: 293 10*3/uL (ref 140–400)
RBC: 5.15 10*6/uL (ref 4.20–5.80)
RDW: 12.7 % (ref 11.0–15.0)
Total Lymphocyte: 26 %
WBC: 9.4 10*3/uL (ref 3.8–10.8)

## 2020-04-12 LAB — LIPID PANEL
Cholesterol: 185 mg/dL (ref <200)
HDL: 37 mg/dL — ABNORMAL LOW (ref >=40)
LDL Cholesterol (Calc): 115 mg/dL (calc) — ABNORMAL HIGH
Non-HDL Cholesterol (Calc): 148 mg/dL (calc) — ABNORMAL HIGH (ref <130)
Total CHOL/HDL Ratio: 5 (calc) — ABNORMAL HIGH (ref <5.0)
Triglycerides: 211 mg/dL — ABNORMAL HIGH (ref <150)

## 2020-04-12 LAB — PSA: PSA: 0.61 ng/mL (ref <=4.0)

## 2020-04-15 ENCOUNTER — Encounter: Payer: Self-pay | Admitting: *Deleted

## 2021-02-10 ENCOUNTER — Encounter: Payer: Self-pay | Admitting: Family Medicine

## 2021-02-10 ENCOUNTER — Other Ambulatory Visit: Payer: Self-pay

## 2021-02-10 ENCOUNTER — Ambulatory Visit (INDEPENDENT_AMBULATORY_CARE_PROVIDER_SITE_OTHER): Payer: 59 | Admitting: Family Medicine

## 2021-02-10 ENCOUNTER — Telehealth: Payer: Self-pay | Admitting: Family Medicine

## 2021-02-10 VITALS — BP 200/112 | HR 114 | Temp 97.4°F | Resp 18 | Ht 71.0 in | Wt 236.0 lb

## 2021-02-10 DIAGNOSIS — I10 Essential (primary) hypertension: Secondary | ICD-10-CM | POA: Diagnosis not present

## 2021-02-10 MED ORDER — HYDROCHLOROTHIAZIDE 25 MG PO TABS: 25.0000 mg | ORAL_TABLET | Freq: Every day | ORAL | 3 refills | Status: DC

## 2021-02-10 MED ORDER — AMOXICILLIN 875 MG PO TABS: 875.0000 mg | ORAL_TABLET | Freq: Two times a day (BID) | ORAL | 0 refills | Status: AC

## 2021-02-10 MED ORDER — FLUTICASONE PROPIONATE 50 MCG/ACT NA SUSP: 2.0000 | Freq: Every day | NASAL | 6 refills | Status: DC

## 2021-02-10 NOTE — Telephone Encounter (Signed)
Pt seen in office

## 2021-02-10 NOTE — Telephone Encounter (Signed)
Had flu a month ago he is having reaccurent headaches would like to come by and get his b/p checked since we have no visits for today.   CB# [phone]
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
## 2021-04-16 ENCOUNTER — Other Ambulatory Visit: Payer: Self-pay

## 2021-04-16 DIAGNOSIS — I10 Essential (primary) hypertension: Secondary | ICD-10-CM

## 2021-04-16 MED ORDER — LISINOPRIL 40 MG PO TABS: ORAL_TABLET | ORAL | 3 refills | Status: DC

## 2021-05-02 ENCOUNTER — Other Ambulatory Visit: Payer: Self-pay

## 2021-05-02 ENCOUNTER — Telehealth: Payer: Self-pay

## 2021-05-02 NOTE — Telephone Encounter (Signed)
Patient's wife (DPR) called to report pt had been taking 1/2 tab (12.5mg ) HCTZ but still felt this was dropping his BP too low so he has stopped taking it at all. She reports his BP has been more normal lately, however he had one episode of elevated BP 160/80 last night. She was asking for recommendations on the medication. Advised her one elevated reading is not unusual. Recommended she record his BP morning and evening for the next week and call us with the readings. We can then forward to Dr. Dennard Schaumann for recommendations. She voiced understanding and will call us next week. Nothing further needed at this time.

## 2021-05-29 ENCOUNTER — Encounter: Payer: Self-pay | Admitting: Family Medicine

## 2021-05-29 ENCOUNTER — Other Ambulatory Visit: Payer: Self-pay

## 2021-05-29 ENCOUNTER — Ambulatory Visit (INDEPENDENT_AMBULATORY_CARE_PROVIDER_SITE_OTHER): Payer: 59 | Admitting: Family Medicine

## 2021-05-29 VITALS — BP 138/92 | HR 95 | Temp 97.5°F | Resp 18 | Ht 71.0 in | Wt 228.0 lb

## 2021-05-29 DIAGNOSIS — R198 Other specified symptoms and signs involving the digestive system and abdomen: Secondary | ICD-10-CM | POA: Diagnosis not present

## 2021-05-29 LAB — URINALYSIS, ROUTINE W REFLEX MICROSCOPIC
Bilirubin Urine: NEGATIVE
Glucose, UA: NEGATIVE
Hgb urine dipstick: NEGATIVE
Ketones, ur: NEGATIVE
Leukocytes,Ua: NEGATIVE
Nitrite: NEGATIVE
Protein, ur: NEGATIVE
Specific Gravity, Urine: 1.024 (ref 1.001–1.035)
pH: 5.5 (ref 5.0–8.0)

## 2021-05-29 MED ORDER — CIPROFLOXACIN HCL 500 MG PO TABS: 500.0000 mg | ORAL_TABLET | Freq: Two times a day (BID) | ORAL | 0 refills | Status: AC

## 2021-05-29 MED ORDER — TAMSULOSIN HCL 0.4 MG PO CAPS: 0.4000 mg | ORAL_CAPSULE | Freq: Every day | ORAL | 3 refills | Status: DC

## 2021-05-29 NOTE — Progress Notes (Signed)
? ?Subjective:  ? ? Patient ID: Bryan Davenport, male    DOB: [birth year], 56 y.o.   MRN:  ? ?Patient presents today complaining of suprapubic fullness and pressure.  Has been like this since December.  It hurts worse if he sitting up.  He feels like something is pressing on his bladder.  He denies any dysuria but he does report increased urgency and frequency.  He denies any hematuria.  He denies any pyuria.  He denies any fevers or chills.  He denies constipation.  He has a bowel movement every day.  He denies any melena or hematochezia.  His blood pressure is better than when I last saw him.  He states if he had average it the systolic blood pressure is in the 1 40-1 50 range and the diastolic blood pressure is in the 90s.  He is unable to take hydrochlorothiazide with lisinopril because it dropped too much. ?Past Medical History:  ?Diagnosis Date  ? Hypertension   ? Kidney stone   ? ?History reviewed. No pertinent surgical history. ?Current Outpatient Medications on File Prior to Visit  ?Medication Sig Dispense Refill  ? lisinopril (ZESTRIL) 40 MG tablet TAKE 1 TABLET BY MOUTH EVERY DAY 90 tablet 3  ? Multiple Vitamin (MULITIVITAMIN WITH MINERALS) TABS Take 1 tablet by mouth daily.    ? hydrochlorothiazide (HYDRODIURIL) 25 MG tablet Take 1 tablet (25 mg total) by mouth daily. (Patient not taking: Reported on 05/29/2021) 90 tablet 3  ? Rimegepant Sulfate (NURTEC) 75 MG TBDP Take 1 tablet by mouth every other day. (Patient not taking: Reported on 02/10/2021) 15 tablet 5  ? sildenafil (VIAGRA) 100 MG tablet Take 0.5-1 tablets (50-100 mg total) by mouth daily as needed for erectile dysfunction. (Patient not taking: Reported on 05/29/2021) 5 tablet 11  ? ?No current facility-administered medications on file prior to visit.  ? ?Allergies  ?Allergen Reactions  ? Sulfonamide Derivatives Rash  ? ?Social History  ? ?Socioeconomic History  ? Marital status: Married  ?  Spouse name: Not on file  ? Number of children: Not on  file  ? Years of education: Not on file  ? Highest education level: Not on file  ?Occupational History  ? Not on file  ?Tobacco Use  ? Smoking status: Never  ? Smokeless tobacco: Never  ?Substance and Sexual Activity  ? Alcohol use: No  ? Drug use: No  ? Sexual activity: Not on file  ?Other Topics Concern  ? Not on file  ?Social History Narrative  ? Not on file  ? ?Social Determinants of Health  ? ?Financial Resource Strain: Not on file  ?Food Insecurity: Not on file  ?Transportation Needs: Not on file  ?Physical Activity: Not on file  ?Stress: Not on file  ?Social Connections: Not on file  ?Intimate Partner Violence: Not on file  ? ? ? ? ?Review of Systems  ?Neurological:  Negative for headaches.  ?All other systems reviewed and are negative. ? ?   ?Objective:  ? Physical Exam ?Vitals reviewed.  ?Constitutional:   ?   General: He is not in acute distress. ?   Appearance: He is well-developed. He is not diaphoretic.  ?HENT:  ?   Head: Normocephalic and atraumatic.  ?   Right Ear: External ear normal.  ?   Left Ear: External ear normal.  ?   Nose: Nose normal.  ?   Mouth/Throat:  ?   Pharynx: No oropharyngeal exudate.  ?Eyes:  ?  Conjunctiva/sclera: Conjunctivae normal.  ?   Pupils: Pupils are equal, round, and reactive to light.  ?Neck:  ?   Thyroid: No thyromegaly.  ?   Vascular: No JVD.  ?Cardiovascular:  ?   Rate and Rhythm: Normal rate and regular rhythm.  ?   Heart sounds: Normal heart sounds. No murmur heard. ?  No friction rub. No gallop.  ?Pulmonary:  ?   Effort: Pulmonary effort is normal. No respiratory distress.  ?   Breath sounds: Normal breath sounds. No wheezing or rales.  ?Chest:  ?   Chest wall: No tenderness.  ?Abdominal:  ?   General: Abdomen is flat. Bowel sounds are normal. There is no distension. There are no signs of injury.  ?   Palpations: Abdomen is soft. There is no shifting dullness, hepatomegaly or mass.  ?   Tenderness: There is no abdominal tenderness. There is no guarding or rebound.   ?   Hernia: There is no hernia in the umbilical area, ventral area, left inguinal area or right inguinal area.  ? ? ?Genitourinary: ?   Pubic Area: No rash.   ?   Penis: Normal.   ?   Testes: Normal.     ?   Right: Mass or tenderness not present.     ?   Left: Mass or tenderness not present.  ?   Epididymis:  ?   Right: Normal.  ?   Left: Normal.  ?Musculoskeletal:  ?   Cervical back: Neck supple.  ?Lymphadenopathy:  ?   Cervical: No cervical adenopathy.  ?Neurological:  ?   Mental Status: He is alert and oriented to person, place, and time.  ?   Cranial Nerves: No cranial nerve deficit.  ?   Motor: No abnormal muscle tone.  ?   Coordination: Coordination normal.  ?   Deep Tendon Reflexes: Reflexes are normal and symmetric.  ? ? ? ? ? ?   ?Assessment & Plan:  ?Suprapubic fullness - Plan: Urinalysis, Routine w reflex microscopic ?I am not certain.  Concerned about possible prostatitis given the duration of symptoms.  I am unable to palpate the bladder through his abdomen so therefore I do not feel that the bladder is distended.  The other potential be an intestinal source of the pain.  I will start by treating prostatitis with Cipro twice daily for 7 days coupled with Flomax 0.4 mg p.o. nightly.  Recheck in 1 week.  If not improving I would proceed to imaging of the abdomen and pelvis given the fact symptoms of been present now for 2 to 3 months.  Once the situation is resolved, I would recommend trying 20 mg a day of lisinopril and 12.5 mg a day of hydrochlorothiazide combined to see if this would help manage his blood pressure more consistently. ?

## 2021-06-17 ENCOUNTER — Ambulatory Visit (INDEPENDENT_AMBULATORY_CARE_PROVIDER_SITE_OTHER): Payer: Commercial Managed Care - PPO | Admitting: Family Medicine

## 2021-06-17 VITALS — BP 136/84 | HR 74 | Temp 97.3°F | Ht 71.0 in | Wt 225.4 lb

## 2021-06-17 DIAGNOSIS — R1031 Right lower quadrant pain: Secondary | ICD-10-CM | POA: Diagnosis not present

## 2021-06-17 NOTE — Progress Notes (Signed)
? ?Subjective:  ? ? Patient ID: Bryan Davenport, male    DOB: 1965-08-11, 56 y.o.   MRN:  ?Please see the patient's last visit on March 16.  Despite treatment for prostatitis, his pain persists.  Now the pain is more in the right lower quadrant.  It hurts worse when he stands up.  It improves when he lays down.  He feels a pressure-like pain near his right inguinal canal.  He denies any mass or bulge.  He is tender to palpation in that area.  He denies any fevers or chills.  He denies any melena or hematochezia.  He denies any hematuria or dysuria.  He denies any constipation.  He denies any diarrhea nausea or vomiting. ?Past Medical History:  ?Diagnosis Date  ?? Hypertension   ?? Kidney stone   ? ?No past surgical history on file. ?Current Outpatient Medications on File Prior to Visit  ?Medication Sig Dispense Refill  ?? lisinopril (ZESTRIL) 40 MG tablet TAKE 1 TABLET BY MOUTH EVERY DAY 90 tablet 3  ?? hydrochlorothiazide (HYDRODIURIL) 25 MG tablet Take 1 tablet (25 mg total) by mouth daily. (Patient not taking: Reported on 06/17/2021) 90 tablet 3  ?? Multiple Vitamin (MULITIVITAMIN WITH MINERALS) TABS Take 1 tablet by mouth daily. (Patient not taking: Reported on 06/17/2021)    ?? Rimegepant Sulfate (NURTEC) 75 MG TBDP Take 1 tablet by mouth every other day. (Patient not taking: Reported on 06/17/2021) 15 tablet 5  ?? sildenafil (VIAGRA) 100 MG tablet Take 0.5-1 tablets (50-100 mg total) by mouth daily as needed for erectile dysfunction. (Patient not taking: Reported on 06/17/2021) 5 tablet 11  ?? tamsulosin (FLOMAX) 0.4 MG CAPS capsule Take 1 capsule (0.4 mg total) by mouth daily. (Patient not taking: Reported on 06/17/2021) 30 capsule 3  ? ?No current facility-administered medications on file prior to visit.  ? ?Allergies  ?Allergen Reactions  ?? Sulfonamide Derivatives Rash  ? ?Social History  ? ?Socioeconomic History  ?? Marital status: Married  ?  Spouse name: Not on file  ?? Number of children: Not on file  ?? Years of  education: Not on file  ?? Highest education level: Not on file  ?Occupational History  ?? Not on file  ?Tobacco Use  ?? Smoking status: Never  ?? Smokeless tobacco: Never  ?Substance and Sexual Activity  ?? Alcohol use: No  ?? Drug use: No  ?? Sexual activity: Not on file  ?Other Topics Concern  ?? Not on file  ?Social History Narrative  ?? Not on file  ? ?Social Determinants of Health  ? ?Financial Resource Strain: Not on file  ?Food Insecurity: Not on file  ?Transportation Needs: Not on file  ?Physical Activity: Not on file  ?Stress: Not on file  ?Social Connections: Not on file  ?Intimate Partner Violence: Not on file  ? ? ? ? ?Review of Systems  ?Neurological:  Negative for headaches.  ?All other systems reviewed and are negative. ? ?   ?Objective:  ? Physical Exam ?Vitals reviewed.  ?Constitutional:   ?   General: He is not in acute distress. ?   Appearance: He is well-developed. He is not diaphoretic.  ?HENT:  ?   Head: Normocephalic and atraumatic.  ?Eyes:  ?   Conjunctiva/sclera: Conjunctivae normal.  ?Neck:  ?   Thyroid: No thyromegaly.  ?   Vascular: No JVD.  ?Cardiovascular:  ?   Rate and Rhythm: Normal rate and regular rhythm.  ?   Heart sounds: Normal  heart sounds. No murmur heard. ?  No friction rub. No gallop.  ?Pulmonary:  ?   Effort: Pulmonary effort is normal. No respiratory distress.  ?   Breath sounds: Normal breath sounds. No wheezing or rales.  ?Chest:  ?   Chest wall: No tenderness.  ?Abdominal:  ?   General: Abdomen is flat. Bowel sounds are normal. There is no distension. There are no signs of injury.  ?   Palpations: Abdomen is soft. There is no shifting dullness, hepatomegaly or mass.  ?   Tenderness: There is abdominal tenderness. There is guarding. There is no rebound.  ?   Hernia: A hernia is present. Hernia is present in the right inguinal area. There is no hernia in the umbilical area, ventral area or left inguinal area.  ? ? ?Genitourinary: ?   Pubic Area: No rash.   ?   Penis:  Normal.   ?   Testes: Normal.     ?   Right: Mass or tenderness not present.     ?   Left: Mass or tenderness not present.  ?   Epididymis:  ?   Right: Normal.  ?   Left: Normal.  ?Musculoskeletal:  ?   Cervical back: Neck supple.  ?Lymphadenopathy:  ?   Cervical: No cervical adenopathy.  ?Neurological:  ?   Mental Status: He is alert and oriented to person, place, and time.  ?   Cranial Nerves: No cranial nerve deficit.  ?   Motor: No abnormal muscle tone.  ?   Coordination: Coordination normal.  ?   Deep Tendon Reflexes: Reflexes are normal and symmetric.  ? ? ? ? ? ?   ?Assessment & Plan:  ?RLQ abdominal pain - Plan: CT Abdomen Pelvis W Contrast ?Patient is now guarding on exam.  He has tenderness palpation in that area.  On Valsalva exam, the patient does have a palpable bulge in the right inguinal canal that I can feel at the tip of the.  I believe he may have a right inguinal hernia.  However given the tenderness and the pain in the morning and we will schedule a CT scan to evaluate further. ?

## 2021-06-26 ENCOUNTER — Ambulatory Visit
Admission: RE | Admit: 2021-06-26 | Discharge: 2021-06-26 | Disposition: A | Discharge status: Home / Self Care | Payer: Commercial Managed Care - PPO | Source: Ambulatory Visit | Attending: Family Medicine | Admitting: Family Medicine

## 2021-06-26 DIAGNOSIS — R1031 Right lower quadrant pain: Secondary | ICD-10-CM

## 2021-06-26 MED ORDER — IOPAMIDOL (ISOVUE-300) INJECTION 61%
100.0000 mL | Freq: Once | INTRAVENOUS | Status: AC | PRN
  Administered 2021-06-26: 100 mL via INTRAVENOUS

## 2021-06-30 ENCOUNTER — Telehealth: Payer: Self-pay

## 2021-06-30 NOTE — Telephone Encounter (Signed)
Pt called and wanted an explanation of what inguinal hernia ct results mean, please called pt? ? ?Pls advise ?

## 2021-07-01 ENCOUNTER — Telehealth: Payer: Self-pay

## 2021-07-01 DIAGNOSIS — R1031 Right lower quadrant pain: Secondary | ICD-10-CM

## 2021-07-01 DIAGNOSIS — K4090 Unilateral inguinal hernia, without obstruction or gangrene, not specified as recurrent: Secondary | ICD-10-CM

## 2021-07-01 NOTE — Telephone Encounter (Signed)
Pt's spouse called in stating that pt's CT results and a referral needed to be faxed to the number provided [phone]. ? ?Cb#: Juandiego Arntzen [phone]  ?

## 2021-07-01 NOTE — Telephone Encounter (Signed)
Referral to Gen Surg placed. The referral coordinator will send all necessary records.  ?

## 2021-07-02 NOTE — Telephone Encounter (Signed)
Pt aware voiced understanding and referrals already sent.  ?

## 2021-07-03 ENCOUNTER — Other Ambulatory Visit: Payer: Self-pay | Admitting: Family Medicine

## 2021-07-03 DIAGNOSIS — K4090 Unilateral inguinal hernia, without obstruction or gangrene, not specified as recurrent: Secondary | ICD-10-CM

## 2021-07-07 ENCOUNTER — Encounter: Payer: Self-pay | Admitting: Family Medicine

## 2021-07-29 DIAGNOSIS — K4090 Unilateral inguinal hernia, without obstruction or gangrene, not specified as recurrent: Secondary | ICD-10-CM | POA: Insufficient documentation

## 2021-10-06 ENCOUNTER — Ambulatory Visit (INDEPENDENT_AMBULATORY_CARE_PROVIDER_SITE_OTHER): Payer: Commercial Managed Care - PPO | Admitting: Family Medicine

## 2021-10-06 VITALS — BP 152/86 | HR 101 | Temp 98.6°F | Ht 71.0 in | Wt 219.0 lb

## 2021-10-06 DIAGNOSIS — K219 Gastro-esophageal reflux disease without esophagitis: Secondary | ICD-10-CM | POA: Diagnosis not present

## 2021-10-06 MED ORDER — PANTOPRAZOLE SODIUM 40 MG PO TBEC: 40.0000 mg | DELAYED_RELEASE_TABLET | Freq: Every day | ORAL | 3 refills | Status: DC

## 2021-10-06 NOTE — Progress Notes (Signed)
Subjective:    Patient ID: Bryan Davenport, male    DOB: 01/23/1966, 56 y.o.   MRN: [id number]  Patient reports increasing heartburn.  He states that every day he has some pain in his chest that is associated with burping.  The pain is unrelated to exertion.  In fact when he works hard, the pain improves.  He describes it as a burning pain.  He has a gas-like pain.  If he takes baking soda, the pain improves temporarily.  He denies any melena or hematochezia.  He denies any vomiting or hematemesis.  He denies any angina or shortness of breath or pleurisy or hemoptysis.  He does state that he has heartburn on a daily basis Past Medical History:  Diagnosis Date   Hypertension    Kidney stone    No past surgical history on file. Current Outpatient Medications on File Prior to Visit  Medication Sig Dispense Refill   hydrochlorothiazide (HYDRODIURIL) 25 MG tablet Take 1 tablet (25 mg total) by mouth daily. 90 tablet 3   lisinopril (ZESTRIL) 40 MG tablet TAKE 1 TABLET BY MOUTH EVERY DAY 90 tablet 3   Multiple Vitamin (MULITIVITAMIN WITH MINERALS) TABS Take 1 tablet by mouth daily. (Patient not taking: Reported on 10/06/2021)     Rimegepant Sulfate (NURTEC) 75 MG TBDP Take 1 tablet by mouth every other day. (Patient not taking: Reported on 06/17/2021) 15 tablet 5   sildenafil (VIAGRA) 100 MG tablet Take 0.5-1 tablets (50-100 mg total) by mouth daily as needed for erectile dysfunction. (Patient not taking: Reported on 06/17/2021) 5 tablet 11   tamsulosin (FLOMAX) 0.4 MG CAPS capsule Take 1 capsule (0.4 mg total) by mouth daily. (Patient not taking: Reported on 06/17/2021) 30 capsule 3   No current facility-administered medications on file prior to visit.   Allergies  Allergen Reactions   Sulfonamide Derivatives Rash   Social History   Socioeconomic History   Marital status: Married    Spouse name: Not on file   Number of children: Not on file   Years of education: Not on file   Highest education  level: Not on file  Occupational History   Not on file  Tobacco Use   Smoking status: Never   Smokeless tobacco: Never  Substance and Sexual Activity   Alcohol use: No   Drug use: No   Sexual activity: Not on file  Other Topics Concern   Not on file  Social History Narrative   Not on file   Social Determinants of Health   Financial Resource Strain: Not on file  Food Insecurity: Not on file  Transportation Needs: Not on file  Physical Activity: Not on file  Stress: Not on file  Social Connections: Not on file  Intimate Partner Violence: Not on file      Review of Systems  Neurological:  Positive for headaches.  All other systems reviewed and are negative.      Objective:   Physical Exam Vitals reviewed.  Constitutional:      General: He is not in acute distress.    Appearance: He is well-developed. He is not diaphoretic.  HENT:     Head: Normocephalic and atraumatic.     Right Ear: External ear normal.     Left Ear: External ear normal.     Nose: Nose normal.     Mouth/Throat:     Pharynx: No oropharyngeal exudate.  Eyes:     Conjunctiva/sclera: Conjunctivae normal.     Pupils:  Pupils are equal, round, and reactive to light.  Neck:     Thyroid: No thyromegaly.     Vascular: No JVD.  Cardiovascular:     Rate and Rhythm: Normal rate and regular rhythm.     Heart sounds: Normal heart sounds. No murmur heard.    No friction rub. No gallop.  Pulmonary:     Effort: Pulmonary effort is normal. No respiratory distress.     Breath sounds: Normal breath sounds. No wheezing or rales.  Chest:     Chest wall: No tenderness.  Abdominal:     General: Bowel sounds are normal. There is no distension.     Palpations: Abdomen is soft. There is no mass.     Tenderness: There is no abdominal tenderness. There is no guarding or rebound.  Musculoskeletal:     Cervical back: Neck supple.  Lymphadenopathy:     Cervical: No cervical adenopathy.  Neurological:     Mental  Status: He is alert and oriented to person, place, and time.     Cranial Nerves: No cranial nerve deficit.     Motor: No abnormal muscle tone.     Coordination: Coordination normal.     Deep Tendon Reflexes: Reflexes are normal and symmetric.           Assessment & Plan:  Gastroesophageal reflux disease, unspecified whether esophagitis present Symptoms sound like GERD.  Begin Protonix 40 mg daily and reassess in 6 weeks.  If he is pain-free in 6 weeks he can discontinue the medication.  If symptoms are worsening, I would recommend an EGD.  If symptoms worsen or change in any way he is to call me immediately

## 2021-10-14 ENCOUNTER — Ambulatory Visit: Payer: Commercial Managed Care - PPO | Admitting: Family Medicine

## 2022-04-03 ENCOUNTER — Ambulatory Visit (INDEPENDENT_AMBULATORY_CARE_PROVIDER_SITE_OTHER): Payer: Commercial Managed Care - PPO | Admitting: Family Medicine

## 2022-04-03 ENCOUNTER — Encounter: Payer: Self-pay | Admitting: Family Medicine

## 2022-04-03 VITALS — BP 130/76 | HR 95 | Temp 98.7°F | Ht 71.0 in | Wt 226.8 lb

## 2022-04-03 DIAGNOSIS — I10 Essential (primary) hypertension: Secondary | ICD-10-CM | POA: Diagnosis not present

## 2022-04-03 DIAGNOSIS — Z1211 Encounter for screening for malignant neoplasm of colon: Secondary | ICD-10-CM | POA: Diagnosis not present

## 2022-04-03 MED ORDER — HYDROCHLOROTHIAZIDE 12.5 MG PO CAPS: 12.5000 mg | ORAL_CAPSULE | Freq: Every day | ORAL | 3 refills | Status: DC

## 2022-04-03 MED ORDER — LISINOPRIL 40 MG PO TABS: ORAL_TABLET | ORAL | 3 refills | Status: DC

## 2022-04-03 NOTE — Progress Notes (Signed)
Subjective:    Patient ID: Bryan Davenport, male    DOB: 04/13/1965, 57 y.o.   MRN: [id number] Since I last saw the patient, he had hernia surgery.  He still deals with intermittent gas and bloating.  He also reports intermittent stomach rumbling as well as constipation.  I believe he is dealing with irritable bowel disorder.  His blood pressure is due to having to cut hydrochlorothiazide in half due to hypotension.  He denies any angina.  He denies any dyspnea on exertion.  He is due for colon cancer screening.  Urology just checked his prostate. Past Medical History:  Diagnosis Date   Hypertension    Kidney stone    No past surgical history on file. No current outpatient medications on file prior to visit.   No current facility-administered medications on file prior to visit.   Allergies  Allergen Reactions   Sulfonamide Derivatives Rash   Social History   Socioeconomic History   Marital status: Married    Spouse name: Not on file   Number of children: Not on file   Years of education: Not on file   Highest education level: Not on file  Occupational History   Not on file  Tobacco Use   Smoking status: Never   Smokeless tobacco: Never  Substance and Sexual Activity   Alcohol use: No   Drug use: No   Sexual activity: Not on file  Other Topics Concern   Not on file  Social History Narrative   Not on file   Social Determinants of Health   Financial Resource Strain: Not on file  Food Insecurity: Not on file  Transportation Needs: Not on file  Physical Activity: Not on file  Stress: Not on file  Social Connections: Not on file  Intimate Partner Violence: Not on file      Review of Systems  Neurological:  Positive for headaches.  All other systems reviewed and are negative.      Objective:   Physical Exam Vitals reviewed.  Constitutional:      General: He is not in acute distress.    Appearance: He is well-developed. He is not diaphoretic.  HENT:      Head: Normocephalic and atraumatic.     Right Ear: External ear normal.     Left Ear: External ear normal.     Nose: Nose normal.     Mouth/Throat:     Pharynx: No oropharyngeal exudate.  Eyes:     Conjunctiva/sclera: Conjunctivae normal.     Pupils: Pupils are equal, round, and reactive to light.  Neck:     Thyroid: No thyromegaly.     Vascular: No JVD.  Cardiovascular:     Rate and Rhythm: Normal rate and regular rhythm.     Heart sounds: Normal heart sounds. No murmur heard.    No friction rub. No gallop.  Pulmonary:     Effort: Pulmonary effort is normal. No respiratory distress.     Breath sounds: Normal breath sounds. No wheezing or rales.  Chest:     Chest wall: No tenderness.  Abdominal:     General: Bowel sounds are normal. There is no distension.     Palpations: Abdomen is soft. There is no mass.     Tenderness: There is no abdominal tenderness. There is no guarding or rebound.  Musculoskeletal:     Cervical back: Neck supple.  Lymphadenopathy:     Cervical: No cervical adenopathy.  Neurological:  Mental Status: He is alert and oriented to person, place, and time.     Cranial Nerves: No cranial nerve deficit.     Motor: No abnormal muscle tone.     Coordination: Coordination normal.     Deep Tendon Reflexes: Reflexes are normal and symmetric.           Assessment & Plan:  Colon cancer screening - Plan: Cologuard  Benign essential HTN - Plan: CBC with Differential/Platelet, Lipid panel, COMPLETE METABOLIC PANEL WITH GFR  Essential hypertension - Plan: lisinopril (ZESTRIL) 40 MG tablet screen for colon cancer with Cologuard.  Check CBC CMP and a lipid panel.  Prostate was recently checked by urology.  Blood pressure today is excellent.  I will decrease hydrochlorothiazide to 12.5 mg daily for his convenience

## 2022-04-04 LAB — CBC WITH DIFFERENTIAL/PLATELET
Absolute Monocytes: 984 cells/uL — ABNORMAL HIGH (ref 200–950)
Basophils Absolute: 46 cells/uL (ref 0–200)
Basophils Relative: 0.5 %
Eosinophils Absolute: 175 cells/uL (ref 15–500)
Eosinophils Relative: 1.9 %
HCT: 47.8 % (ref 38.5–50.0)
Hemoglobin: 16.7 g/dL (ref 13.2–17.1)
Lymphs Abs: 2696 cells/uL (ref 850–3900)
MCH: 31 pg (ref 27.0–33.0)
MCHC: 34.9 g/dL (ref 32.0–36.0)
MCV: 88.8 fL (ref 80.0–100.0)
MPV: 9.8 fL (ref 7.5–12.5)
Monocytes Relative: 10.7 %
Neutro Abs: 5299 cells/uL (ref 1500–7800)
Neutrophils Relative %: 57.6 %
Platelets: 328 10*3/uL (ref 140–400)
RBC: 5.38 10*6/uL (ref 4.20–5.80)
RDW: 12.4 % (ref 11.0–15.0)
Total Lymphocyte: 29.3 %
WBC: 9.2 10*3/uL (ref 3.8–10.8)

## 2022-04-04 LAB — COMPLETE METABOLIC PANEL WITH GFR
AG Ratio: 1.6 (calc) (ref 1.0–2.5)
ALT: 32 U/L (ref 9–46)
AST: 23 U/L (ref 10–35)
Albumin: 4.4 g/dL (ref 3.6–5.1)
Alkaline phosphatase (APISO): 90 U/L (ref 35–144)
BUN: 18 mg/dL (ref 7–25)
CO2: 29 mmol/L (ref 20–32)
Calcium: 9.6 mg/dL (ref 8.6–10.3)
Chloride: 104 mmol/L (ref 98–110)
Creat: 1.05 mg/dL (ref 0.70–1.30)
Globulin: 2.7 g/dL (calc) (ref 1.9–3.7)
Glucose, Bld: 102 mg/dL — ABNORMAL HIGH (ref 65–99)
Potassium: 4.2 mmol/L (ref 3.5–5.3)
Sodium: 143 mmol/L (ref 135–146)
Total Bilirubin: 0.5 mg/dL (ref 0.2–1.2)
Total Protein: 7.1 g/dL (ref 6.1–8.1)
eGFR: 83 mL/min/{1.73_m2} (ref >=60)

## 2022-04-04 LAB — LIPID PANEL
Cholesterol: 171 mg/dL (ref <200)
HDL: 36 mg/dL — ABNORMAL LOW (ref >=40)
LDL Cholesterol (Calc): 94 mg/dL (calc)
Non-HDL Cholesterol (Calc): 135 mg/dL (calc) — ABNORMAL HIGH (ref <130)
Total CHOL/HDL Ratio: 4.8 (calc) (ref <5.0)
Triglycerides: 290 mg/dL — ABNORMAL HIGH (ref <150)

## 2022-06-29 LAB — COLOGUARD: COLOGUARD: NEGATIVE

## 2022-10-01 ENCOUNTER — Telehealth: Payer: Self-pay | Admitting: Family Medicine

## 2022-10-01 NOTE — Telephone Encounter (Signed)
Patient's wife Toniann Fail called on behalf of patient to follow up on doses of hydrochlorothiazide (MICROZIDE) 12.5 MG capsule and lisinopril (ZESTRIL) 40 MG tablet [[id number]]   Patient stated recent change of dose/pill has given him heartburn and other symptoms; stated he felt better when he was taking half of the 25MG  peach pill which was a dose of 12.5MG . Provider changed script to the 12.5MG  dose which is a blue capsule. He stated he was doing better when he was splitting the peach 25MG  pill in half.   Requesting call back to discuss, and request either a different pill containing 12.5MG  (doesn't want the blue capsule), or a refill of the 25MG  peach pills. Patient prefers a new pill which doesn't have to be split in half.  Also expressed concerns that 12.5MG  may be too strong; causes patient to feel weak/faint at times. May need a different dose altogether.   Pharmacy confirmed as  [id number] [place], [place] - [address] AT [address] [place] [address] Phone: [phone]  Fax: [phone] DEA #: [id number]   Please advise patient at [phone], or contact Toniann Fail at [phone].

## 2022-10-08 ENCOUNTER — Encounter: Payer: Self-pay | Admitting: Family Medicine

## 2022-10-08 ENCOUNTER — Ambulatory Visit (INDEPENDENT_AMBULATORY_CARE_PROVIDER_SITE_OTHER): Payer: Commercial Managed Care - PPO | Admitting: Family Medicine

## 2022-10-08 VITALS — BP 140/80 | HR 71 | Temp 98.6°F | Ht 71.0 in | Wt 214.8 lb

## 2022-10-08 DIAGNOSIS — R5383 Other fatigue: Secondary | ICD-10-CM | POA: Diagnosis not present

## 2022-10-08 DIAGNOSIS — I10 Essential (primary) hypertension: Secondary | ICD-10-CM

## 2022-10-08 LAB — CBC WITH DIFFERENTIAL/PLATELET
Absolute Monocytes: 703 cells/uL (ref 200–950)
Basophils Absolute: 30 cells/uL (ref 0–200)
Basophils Relative: 0.4 %
Eosinophils Absolute: 81 cells/uL (ref 15–500)
Eosinophils Relative: 1.1 %
HCT: 49.7 % (ref 38.5–50.0)
Hemoglobin: 16.6 g/dL (ref 13.2–17.1)
Lymphs Abs: 1968 cells/uL (ref 850–3900)
MCH: 30.6 pg (ref 27.0–33.0)
MCHC: 33.4 g/dL (ref 32.0–36.0)
MCV: 91.7 fL (ref 80.0–100.0)
MPV: 9.8 fL (ref 7.5–12.5)
Monocytes Relative: 9.5 %
Neutro Abs: 4618 cells/uL (ref 1500–7800)
Neutrophils Relative %: 62.4 %
Platelets: 300 10*3/uL (ref 140–400)
RBC: 5.42 10*6/uL (ref 4.20–5.80)
RDW: 12.6 % (ref 11.0–15.0)
Total Lymphocyte: 26.6 %
WBC: 7.4 10*3/uL (ref 3.8–10.8)

## 2022-10-08 MED ORDER — HYDROCHLOROTHIAZIDE 25 MG PO TABS: 25.0000 mg | ORAL_TABLET | Freq: Every day | ORAL | 3 refills | Status: DC

## 2022-10-08 MED ORDER — FAMOTIDINE 40 MG PO TABS: 40.0000 mg | ORAL_TABLET | Freq: Every day | ORAL | 3 refills | Status: DC

## 2022-10-08 NOTE — Progress Notes (Signed)
Subjective:    Patient ID: Bryan Davenport, male    DOB: Sep 05, 1965, 57 y.o.   MRN: [id number]  Patient states that when he stopped cutting his hydrochlorothiazide tablets in half and started 12.5 mg capsules daily he reports feeling extremely tired.  He denies any angina but he does report chest pain.  He states been having a burning pain in the center of his chest for more than a year.  It feels better after he burps.  I tried the patient on pantoprazole but he saw no benefit with medication last summer.  He does take ibuprofen and Advil.  Completed.  He is not currently on anything for acid reflux.  Past Medical History:  Diagnosis Date   Hypertension    Kidney stone    No past surgical history on file. Current Outpatient Medications on File Prior to Visit  Medication Sig Dispense Refill   hydrochlorothiazide (MICROZIDE) 12.5 MG capsule Take 1 capsule (12.5 mg total) by mouth daily. 90 capsule 3   lisinopril (ZESTRIL) 40 MG tablet TAKE 1 TABLET BY MOUTH EVERY DAY 90 tablet 3   No current facility-administered medications on file prior to visit.   Allergies  Allergen Reactions   Sulfonamide Derivatives Rash   Social History   Socioeconomic History   Marital status: Married    Spouse name: Not on file   Number of children: Not on file   Years of education: Not on file   Highest education level: Not on file  Occupational History   Not on file  Tobacco Use   Smoking status: Never   Smokeless tobacco: Never  Substance and Sexual Activity   Alcohol use: No   Drug use: No   Sexual activity: Not on file  Other Topics Concern   Not on file  Social History Narrative   Not on file   Social Determinants of Health   Financial Resource Strain: Not on file  Food Insecurity: Not on file  Transportation Needs: Not on file  Physical Activity: Not on file  Stress: Not on file  Social Connections: Not on file  Intimate Partner Violence: Not on file      Review of Systems   Neurological:  Positive for headaches.  All other systems reviewed and are negative.      Objective:   Physical Exam Vitals reviewed.  Constitutional:      General: He is not in acute distress.    Appearance: He is well-developed. He is not diaphoretic.  HENT:     Head: Normocephalic and atraumatic.     Right Ear: External ear normal.     Left Ear: External ear normal.     Nose: Nose normal.     Mouth/Throat:     Pharynx: No oropharyngeal exudate.  Eyes:     Conjunctiva/sclera: Conjunctivae normal.     Pupils: Pupils are equal, round, and reactive to light.  Neck:     Thyroid: No thyromegaly.     Vascular: No JVD.  Cardiovascular:     Rate and Rhythm: Normal rate and regular rhythm.     Heart sounds: Normal heart sounds. No murmur heard.    No friction rub. No gallop.  Pulmonary:     Effort: Pulmonary effort is normal. No respiratory distress.     Breath sounds: Normal breath sounds. No wheezing or rales.  Chest:     Chest wall: No tenderness.  Abdominal:     General: Bowel sounds are normal. There is no  distension.     Palpations: Abdomen is soft. There is no mass.     Tenderness: There is no abdominal tenderness. There is no guarding or rebound.  Musculoskeletal:     Cervical back: Neck supple.  Lymphadenopathy:     Cervical: No cervical adenopathy.  Neurological:     Mental Status: He is alert and oriented to person, place, and time.     Cranial Nerves: No cranial nerve deficit.     Motor: No abnormal muscle tone.     Coordination: Coordination normal.     Deep Tendon Reflexes: Reflexes are normal and symmetric.           Assessment & Plan:  Fatigue, unspecified type - Plan: CBC with Differential/Platelet, COMPLETE METABOLIC PANEL WITH GFR, TSH, Vitamin B12, Testosterone Total,Free,Bio, Males I will be glad to switch the patient back from hydrochlorothiazide capsules to hydrochlorothiazide tablets.  He can break the 25 mg tablets in half however I do not  think this is the cause of his fatigue.  I will check a CBC a CMP and a B12 level and testosterone level to evaluate for other potential causes.  Recommended the patient's stop NSAIDs specifically bad reflux.  If not improving will consult GI.  We also discussed a cardiac calcium score.  The patient would like to do this to rule stratify himself

## 2022-10-30 ENCOUNTER — Ambulatory Visit
Admission: RE | Admit: 2022-10-30 | Discharge: 2022-10-30 | Disposition: A | Discharge status: Home / Self Care | Payer: Self-pay | Source: Ambulatory Visit | Attending: Family Medicine | Admitting: Family Medicine

## 2022-10-30 DIAGNOSIS — I10 Essential (primary) hypertension: Secondary | ICD-10-CM | POA: Insufficient documentation

## 2022-11-06 ENCOUNTER — Other Ambulatory Visit: Payer: Self-pay

## 2022-11-06 DIAGNOSIS — I2510 Atherosclerotic heart disease of native coronary artery without angina pectoris: Secondary | ICD-10-CM

## 2022-11-06 DIAGNOSIS — R079 Chest pain, unspecified: Secondary | ICD-10-CM

## 2022-11-06 MED ORDER — ASPIRIN 81 MG PO TBEC
81.0000 mg | DELAYED_RELEASE_TABLET | Freq: Every day | ORAL | Status: DC
Start: 2022-11-06 — End: 2023-03-23

## 2022-11-06 MED ORDER — ROSUVASTATIN CALCIUM 20 MG PO TABS
20.0000 mg | ORAL_TABLET | Freq: Every day | ORAL | 1 refills | Status: DC
Start: 2022-11-06 — End: 2023-02-26

## 2022-12-10 ENCOUNTER — Encounter: Payer: Self-pay | Admitting: Family Medicine

## 2022-12-10 ENCOUNTER — Ambulatory Visit: Payer: Commercial Managed Care - PPO | Admitting: Family Medicine

## 2022-12-10 VITALS — BP 136/86 | HR 69 | Temp 98.5°F | Ht 71.0 in | Wt 213.2 lb

## 2022-12-10 DIAGNOSIS — R079 Chest pain, unspecified: Secondary | ICD-10-CM | POA: Diagnosis not present

## 2022-12-10 MED ORDER — VALSARTAN 320 MG PO TABS: 320.0000 mg | ORAL_TABLET | Freq: Every day | ORAL | 3 refills | Status: DC

## 2022-12-10 MED ORDER — PANTOPRAZOLE SODIUM 40 MG PO TBEC: 40.0000 mg | DELAYED_RELEASE_TABLET | Freq: Every day | ORAL | 3 refills | Status: DC

## 2022-12-10 NOTE — Progress Notes (Signed)
Subjective:    Patient ID: Bryan Davenport, male    DOB: [birth year], 57 y.o.   MRN: [id number]  Patient continues to report left-sided chest pain just below his nipple and just posterior to his nipple.  It comes and goes.  Tends to be worse after eating and when he lays down at night.  He also reports a sour taste in the back of his mouth especially when he lies down.  Past history is significant for frequent NSAID use.  He states that he tried a proton pump inhibitor for this but he did not take it very long.  I believe he gave up prematurely.  He had a CT scan of his chest that did show coronary artery calcifications but otherwise was normal.  He does report indigestion and bloating after eating on occasion Past Medical History:  Diagnosis Date   Hypertension    Kidney stone    No past surgical history on file. Current Outpatient Medications on File Prior to Visit  Medication Sig Dispense Refill   aspirin EC 81 MG tablet Take 1 tablet (81 mg total) by mouth daily. Swallow whole.     Bacillus Coagulans-Inulin (PROBIOTIC) 1-250 BILLION-MG CAPS Take by mouth.     Omega-3 Fatty Acids (FISH OIL) 500 MG CAPS Take by mouth.     famotidine (PEPCID) 40 MG tablet Take 1 tablet (40 mg total) by mouth daily. (Patient not taking: Reported on 12/10/2022) 30 tablet 3   hydrochlorothiazide (HYDRODIURIL) 25 MG tablet Take 1 tablet (25 mg total) by mouth daily. (Patient not taking: Reported on 12/10/2022) 90 tablet 3   rosuvastatin (CRESTOR) 20 MG tablet Take 1 tablet (20 mg total) by mouth daily. (Patient not taking: Reported on 12/10/2022) 90 tablet 1   No current facility-administered medications on file prior to visit.   Allergies  Allergen Reactions   Sulfonamide Derivatives Rash   Social History   Socioeconomic History   Marital status: Married    Spouse name: Not on file   Number of children: Not on file   Years of education: Not on file   Highest education level: Not on file  Occupational  History   Not on file  Tobacco Use   Smoking status: Never   Smokeless tobacco: Never  Substance and Sexual Activity   Alcohol use: No   Drug use: No   Sexual activity: Not on file  Other Topics Concern   Not on file  Social History Narrative   Not on file   Social Determinants of Health   Financial Resource Strain: Not on file  Food Insecurity: Not on file  Transportation Needs: Not on file  Physical Activity: Not on file  Stress: Not on file  Social Connections: Not on file  Intimate Partner Violence: Not on file      Review of Systems  Neurological:  Positive for headaches.  All other systems reviewed and are negative.      Objective:   Physical Exam Vitals reviewed.  Constitutional:      General: He is not in acute distress.    Appearance: He is well-developed. He is not diaphoretic.  HENT:     Head: Normocephalic and atraumatic.     Right Ear: External ear normal.     Left Ear: External ear normal.     Nose: Nose normal.     Mouth/Throat:     Pharynx: No oropharyngeal exudate.  Eyes:     Conjunctiva/sclera: Conjunctivae normal.  Pupils: Pupils are equal, round, and reactive to light.  Neck:     Thyroid: No thyromegaly.     Vascular: No JVD.  Cardiovascular:     Rate and Rhythm: Normal rate and regular rhythm.     Heart sounds: Normal heart sounds. No murmur heard.    No friction rub. No gallop.  Pulmonary:     Effort: Pulmonary effort is normal. No respiratory distress.     Breath sounds: Normal breath sounds. No wheezing or rales.  Chest:     Chest wall: No tenderness.  Abdominal:     General: Bowel sounds are normal. There is no distension.     Palpations: Abdomen is soft. There is no mass.     Tenderness: There is no abdominal tenderness. There is no guarding or rebound.  Musculoskeletal:     Cervical back: Neck supple.  Lymphadenopathy:     Cervical: No cervical adenopathy.  Neurological:     Mental Status: He is alert and oriented to  person, place, and time.     Cranial Nerves: No cranial nerve deficit.     Motor: No abnormal muscle tone.     Coordination: Coordination normal.     Deep Tendon Reflexes: Reflexes are normal and symmetric.           Assessment & Plan:  Left-sided chest pain - Plan: Ambulatory referral to Gastroenterology Patient does have a frequent cough and has to clear his throat.  Therefore we will discontinue lisinopril and start the patient on valsartan 320 mg daily.  I believe his left-sided chest pain and even his chronic cough may be a result of acid reflux.  I recommended Protonix 40 mg a day and have asked the patient to take this for a full month before abandoning the medication.  Consult GI for an EGD if the medication is not helping

## 2023-01-27 ENCOUNTER — Telehealth: Payer: Self-pay

## 2023-01-27 NOTE — Telephone Encounter (Signed)
Copied from CRM [id number]. Topic: Clinical - Prescription Issue >> Jan 27, 2023  8:51 AM Georgeanna Harrison H wrote: Reason for CRM: Pts wife called in and wanted to let the provider know that, pt went back to his old high blood pressure medication, Lisinopril because the new one, Valsartan made his blood pressure run high for a week.

## 2023-02-17 ENCOUNTER — Encounter: Payer: Self-pay | Admitting: Family Medicine

## 2023-02-26 ENCOUNTER — Encounter: Payer: Self-pay | Admitting: Nurse Practitioner

## 2023-02-26 ENCOUNTER — Ambulatory Visit: Payer: Commercial Managed Care - PPO | Admitting: Nurse Practitioner

## 2023-02-26 VITALS — BP 152/86 | HR 88 | Ht 71.0 in | Wt 218.2 lb

## 2023-02-26 DIAGNOSIS — R079 Chest pain, unspecified: Secondary | ICD-10-CM

## 2023-02-26 DIAGNOSIS — R1010 Upper abdominal pain, unspecified: Secondary | ICD-10-CM

## 2023-02-26 NOTE — Progress Notes (Signed)
Noted  

## 2023-02-26 NOTE — Progress Notes (Signed)
Brief Narrative 57 y.o. yo male known remotely to Dr.  Marina Goodell with a past medical history not limited to diverticulosis, renal cyst referred by PCP for chest pain   ASSESSMENT    Recurrent left lower chest pain but with some migration to right side of chest.  Esophageal pain / pyrosis seems unlikely source and he doesn't feel course of PPI alleviated the pain.  Seems more like chest wall pain though pain not reproducible on exam and it isn't exacerbated by coughing or other activity.   Generalized upper abdominal burning, better with food.  Cause unclear but maybe gastritis or PUD? Functional pain?   Colon cancer screening.  Negative Cologuard April 2024. No FMH of colon cancer.   See PMH for any additional medical history   PLAN    EGD probably low yield but given persistent symptoms will proceed with EGD. The risks and benefits of EGD with possible biopsies were discussed with the patient who agrees to proceed.    HPI   Chief complaint :  chest pains, upper abdominal burning  Brief GI History: Bryan Davenport was last seen in 2009 for chest pain. EGD was negative. Pain resolved at some point .   Interval History:  About a year ago Bryan Davenport began having left sided chest pain but cannot remember if pain similar to what he had back in 2009. The pain is described as "achy,  mainly left sided but migrates to right side at times. It isn't associated with exertion. It is not exacerbated with physical activity, coughing or deep breaths.   Belching doesn't help. Saw PCP and had a cardiac CT that did show coronary artery calcifications but otherwise was normal.  He tried a PPI for about two months then stopped it because he didn't think it was reflux related pain. The chest pain has subsided but he still doesn't think it was reflux related.  Since discontinuation of the PPI he has been having burning generalized upper abdominal pain. The pain is worse on empty stomach.  He has had a couple of episodes  of nausea after waiting too long to eat in between meals.  Baking soda helps the burning. Has frequent headaches and takes Advil on a regular basis. No unexplained weight loss in fact he thinks his weight is up some as he is eating a little more to help with abdominal burning.   GI History / Studies   Negative Cologuard April 2024.  EGD 2009 for chest pain  --Normal.    Labs      Latest Ref Rng & Units 10/08/2022   11:45 AM 04/03/2022    2:18 PM 04/11/2020    4:23 PM  CBC  WBC 3.8 - 10.8 Thousand/uL 7.4  9.2  9.4   Hemoglobin 13.2 - 17.1 g/dL 86.5  78.4  69.6   Hematocrit 38.5 - 50.0 % 49.7  47.8  46.6   Platelets 140 - 400 Thousand/uL 300  328  293     No results found for: "LIPASE"    Latest Ref Rng & Units 10/08/2022   11:45 AM 04/03/2022    2:18 PM 04/11/2020    4:23 PM  CMP  Glucose 65 - 99 mg/dL 96  295  90   BUN 7 - 25 mg/dL 18  18  14    Creatinine 0.70 - 1.30 mg/dL 2.84  1.32  4.40   Sodium 135 - 146 mmol/L 142  143  144   Potassium 3.5 - 5.3 mmol/L  4.2  4.2  4.0   Chloride 98 - 110 mmol/L 104  104  107   CO2 20 - 32 mmol/L 25  29  23    Calcium 8.6 - 10.3 mg/dL 40.9  9.6  9.6   Total Protein 6.1 - 8.1 g/dL 6.8  7.1  6.6   Total Bilirubin 0.2 - 1.2 mg/dL 1.0  0.5  0.4   AST 10 - 35 U/L 22  23  21    ALT 9 - 46 U/L 27  32  31     CT AP with contrast April 2024 for evaluation of RLQ hernia IMPRESSION: Small right inguinal hernia containing fat, stable since 2013.   Sigmoid diverticulosis.  No active diverticulitis.   Bilateral benign-appearing renal cysts.   No acute findings.   Past Medical History:  Diagnosis Date   GERD (gastroesophageal reflux disease)    Hypertension    Kidney stone    Past Surgical History:  Procedure Laterality Date   INGUINAL HERNIA REPAIR Right    History reviewed. No pertinent family history. Social History   Tobacco Use   Smoking status: Never   Smokeless tobacco: Never  Vaping Use   Vaping status: Never Used   Substance Use Topics   Alcohol use: No   Drug use: No   Current Outpatient Medications  Medication Sig Dispense Refill   aspirin EC 81 MG tablet Take 1 tablet (81 mg total) by mouth daily. Swallow whole.     Bacillus Coagulans-Inulin (PROBIOTIC) 1-250 BILLION-MG CAPS Take by mouth.     Omega-3 Fatty Acids (FISH OIL) 500 MG CAPS Take by mouth.     valsartan (DIOVAN) 320 MG tablet Take 1 tablet (320 mg total) by mouth daily. 90 tablet 3   hydrochlorothiazide (HYDRODIURIL) 25 MG tablet Take 1 tablet (25 mg total) by mouth daily. (Patient not taking: Reported on 02/26/2023) 90 tablet 3   No current facility-administered medications for this visit.   Allergies  Allergen Reactions   Sulfonamide Derivatives Rash     Review of Systems: All systems reviewed and negative except where noted in HPI.   Wt Readings from Last 3 Encounters:  02/26/23 218 lb 4 oz (99 kg)  12/10/22 213 lb 3.2 oz (96.7 kg)  10/08/22 214 lb 12.8 oz (97.4 kg)    Physical Exam:  BP (!) 152/86   Pulse 88   Ht 5\' 11"  (1.803 m)   Wt 218 lb 4 oz (99 kg)   SpO2 95%   BMI 30.44 kg/m  Constitutional:  Pleasant, generally well appearing male in no acute distress. Psychiatric:  Normal mood and affect. Behavior is normal. EENT: Pupils normal.  Conjunctivae are normal. No scleral icterus. Neck supple.  Cardiovascular: Normal rate, regular rhythm.  Pulmonary/chest: Effort normal and breath sounds normal. No wheezing, rales or rhonchi. Abdominal: Soft, nondistended, nontender. Bowel sounds active throughout. There are no masses palpable. No hepatomegaly Musculoskeletal: No chest wall tenderness to palpation. Neurological: Alert and oriented to person place and time.   Willette Cluster, NP  02/26/2023, 10:30 AM  Cc:  Referring Provider Donita Brooks, MD

## 2023-02-26 NOTE — Patient Instructions (Signed)
You have been scheduled for an endoscopy. Please follow written instructions given to you at your visit today.  If you use inhalers (even only as needed), please bring them with you on the day of your procedure.  If you take any of the following medications, they will need to be adjusted prior to your procedure:   DO NOT TAKE 7 DAYS PRIOR TO TEST- Trulicity (dulaglutide) Ozempic, Wegovy (semaglutide) Mounjaro (tirzepatide) Bydureon Bcise (exanatide extended release)  DO NOT TAKE 1 DAY PRIOR TO YOUR TEST Rybelsus (semaglutide) Adlyxin (lixisenatide) Victoza (liraglutide) Byetta (exanatide) ___________________________________________________________________________     _______________________________________________________  If your blood pressure at your visit was 140/90 or greater, please contact your primary care physician to follow up on this.  _______________________________________________________  If you are age 8 or older, your body mass index should be between 23-30. Your Body mass index is 30.44 kg/m. If this is out of the aforementioned range listed, please consider follow up with your Primary Care Provider.  If you are age 31 or younger, your body mass index should be between 19-25. Your Body mass index is 30.44 kg/m. If this is out of the aformentioned range listed, please consider follow up with your Primary Care Provider.   ________________________________________________________  The [facility] providers would like to encourage you to use [facility] to communicate with providers for non-urgent requests or questions.  Due to long hold times on the telephone, sending your provider a message by [facility] may be a faster and more efficient way to get a response.  Please allow 48 business hours for a response.  Please remember that this is for non-urgent requests.  _______________________________________________________ It was a pleasure to see you today!  Thank you for  trusting me with your gastrointestinal care!

## 2023-03-23 ENCOUNTER — Encounter: Payer: Self-pay | Admitting: Internal Medicine

## 2023-03-23 ENCOUNTER — Ambulatory Visit: Payer: Commercial Managed Care - PPO | Admitting: Internal Medicine

## 2023-03-23 VITALS — BP 168/92 | HR 12 | Temp 88.0°F | Resp 19 | Ht 71.0 in | Wt 218.0 lb

## 2023-03-23 DIAGNOSIS — K449 Diaphragmatic hernia without obstruction or gangrene: Secondary | ICD-10-CM

## 2023-03-23 DIAGNOSIS — K269 Duodenal ulcer, unspecified as acute or chronic, without hemorrhage or perforation: Secondary | ICD-10-CM | POA: Diagnosis not present

## 2023-03-23 DIAGNOSIS — R1010 Upper abdominal pain, unspecified: Secondary | ICD-10-CM

## 2023-03-23 DIAGNOSIS — R079 Chest pain, unspecified: Secondary | ICD-10-CM | POA: Diagnosis not present

## 2023-03-23 DIAGNOSIS — K2980 Duodenitis without bleeding: Secondary | ICD-10-CM

## 2023-03-23 MED ORDER — SODIUM CHLORIDE 0.9 % IV SOLN: 500.0000 mL | Freq: Once | INTRAVENOUS | Status: DC

## 2023-03-23 MED ORDER — PANTOPRAZOLE SODIUM 40 MG PO TBEC: 40.0000 mg | DELAYED_RELEASE_TABLET | Freq: Every day | ORAL | 6 refills | Status: AC

## 2023-03-23 NOTE — Progress Notes (Signed)
 To pacu, VSS. Report to RN.tb

## 2023-03-23 NOTE — Progress Notes (Signed)
 Expand All Collapse All        Brief Narrative 58 y.o. yo male known remotely to Dr.  Abran with a past medical history not limited to diverticulosis, renal cyst referred by PCP for chest pain    ASSESSMENT     Recurrent left lower chest pain but with some migration to right side of chest.  Esophageal pain / pyrosis seems unlikely source and he doesn't feel course of PPI alleviated the pain.  Seems more like chest wall pain though pain not reproducible on exam and it isn't exacerbated by coughing or other activity.    Generalized upper abdominal burning, better with food.  Cause unclear but maybe gastritis or PUD? Functional pain?    Colon cancer screening.  Negative Cologuard April 2024. No FMH of colon cancer.    See PMH for any additional medical history     PLAN    EGD probably low yield but given persistent symptoms will proceed with EGD. The risks and benefits of EGD with possible biopsies were discussed with the patient who agrees to proceed.      HPI    Chief complaint :  chest pains, upper abdominal burning   Brief GI History: Oneil was last seen in 2009 for chest pain. EGD was negative. Pain resolved at some point .    Interval History:  About a year ago Oneil began having left sided chest pain but cannot remember if pain similar to what he had back in 2009. The pain is described as achy,  mainly left sided but migrates to right side at times. It isn't associated with exertion. It is not exacerbated with physical activity, coughing or deep breaths.   Belching doesn't help. Saw PCP and had a cardiac CT that did show coronary artery calcifications but otherwise was normal.  He tried a PPI for about two months then stopped it because he didn't think it was reflux related pain. The chest pain has subsided but he still doesn't think it was reflux related.  Since discontinuation of the PPI he has been having burning generalized upper abdominal pain. The pain is worse on empty  stomach.  He has had a couple of episodes of nausea after waiting too long to eat in between meals.  Baking soda helps the burning. Has frequent headaches and takes Advil on a regular basis. No unexplained weight loss in fact he thinks his weight is up some as he is eating a little more to help with abdominal burning.     GI History / Studies    Negative Cologuard April 2024.   EGD 2009 for chest pain  --Normal.      Labs        Latest Ref Rng & Units 10/08/2022   11:45 AM 04/03/2022    2:18 PM 04/11/2020    4:23 PM  CBC  WBC 3.8 - 10.8 Thousand/uL 7.4  9.2  9.4   Hemoglobin 13.2 - 17.1 g/dL 83.3  83.2  83.8   Hematocrit 38.5 - 50.0 % 49.7  47.8  46.6   Platelets 140 - 400 Thousand/uL 300  328  293       Recent Labs  No results found for: LIPASE       Latest Ref Rng & Units 10/08/2022   11:45 AM 04/03/2022    2:18 PM 04/11/2020    4:23 PM  CMP  Glucose 65 - 99 mg/dL 96  897  90   BUN 7 - 25 mg/dL  18  18  14    Creatinine 0.70 - 1.30 mg/dL 9.02  8.94  9.03   Sodium 135 - 146 mmol/L 142  143  144   Potassium 3.5 - 5.3 mmol/L 4.2  4.2  4.0   Chloride 98 - 110 mmol/L 104  104  107   CO2 20 - 32 mmol/L 25  29  23    Calcium  8.6 - 10.3 mg/dL 89.9  9.6  9.6   Total Protein 6.1 - 8.1 g/dL 6.8  7.1  6.6   Total Bilirubin 0.2 - 1.2 mg/dL 1.0  0.5  0.4   AST 10 - 35 U/L 22  23  21    ALT 9 - 46 U/L 27  32  31       CT AP with contrast April 2024 for evaluation of RLQ hernia IMPRESSION: Small right inguinal hernia containing fat, stable since 2013.   Sigmoid diverticulosis.  No active diverticulitis.   Bilateral benign-appearing renal cysts.   No acute findings.         Past Medical History:  Diagnosis Date   GERD (gastroesophageal reflux disease)     Hypertension     Kidney stone               Past Surgical History:  Procedure Laterality Date   INGUINAL HERNIA REPAIR Right          History reviewed. No pertinent family history.     Social History  Social  History        Tobacco Use   Smoking status: Never   Smokeless tobacco: Never  Vaping Use   Vaping status: Never Used  Substance Use Topics   Alcohol use: No   Drug use: No            Current Outpatient Medications  Medication Sig Dispense Refill   aspirin  EC 81 MG tablet Take 1 tablet (81 mg total) by mouth daily. Swallow whole.       Bacillus Coagulans-Inulin (PROBIOTIC) 1-250 BILLION-MG CAPS Take by mouth.       Omega-3 Fatty Acids (FISH OIL) 500 MG CAPS Take by mouth.       valsartan  (DIOVAN ) 320 MG tablet Take 1 tablet (320 mg total) by mouth daily. 90 tablet 3   hydrochlorothiazide  (HYDRODIURIL ) 25 MG tablet Take 1 tablet (25 mg total) by mouth daily. (Patient not taking: Reported on 02/26/2023) 90 tablet 3      No current facility-administered medications for this visit.      Allergies      Allergies  Allergen Reactions   Sulfonamide Derivatives Rash          Review of Systems: All systems reviewed and negative except where noted in HPI.       Wt Readings from Last 3 Encounters:  02/26/23 218 lb 4 oz (99 kg)  12/10/22 213 lb 3.2 oz (96.7 kg)  10/08/22 214 lb 12.8 oz (97.4 kg)      Physical Exam:  BP (!) 152/86   Pulse 88   Ht 5' 11 (1.803 m)   Wt 218 lb 4 oz (99 kg)   SpO2 95%   BMI 30.44 kg/m  Constitutional:  Pleasant, generally well appearing male in no acute distress. Psychiatric:  Normal mood and affect. Behavior is normal. EENT: Pupils normal.  Conjunctivae are normal. No scleral icterus. Neck supple.  Cardiovascular: Normal rate, regular rhythm.  Pulmonary/chest: Effort normal and breath sounds normal. No wheezing, rales or rhonchi. Abdominal: Soft, nondistended,  nontender. Bowel sounds active throughout. There are no masses palpable. No hepatomegaly Musculoskeletal: No chest wall tenderness to palpation. Neurological: Alert and oriented to person place and time.     Vina Dasen, NP  02/26/2023, 10:30 AM   Cc:  Referring  Provider Duanne Butler DASEN, MD

## 2023-03-23 NOTE — Progress Notes (Signed)
 Pt's states no medical or surgical changes since previsit or office visit.

## 2023-03-23 NOTE — Op Note (Signed)
 [facility] Patient Name: Bryan Davenport Procedure Date: 03/23/2023 10:34 AM MRN: [id number] Endoscopist: Norleen SAILOR. Abran , MD, [id number] Age: 58 Referring MD:  Date of Birth: [birth year] Gender: Male Account #: [id number] Procedure:                Upper GI endoscopy with biopsies Indications:              Dyspepsia, Unexplained chest pain Medicines:                Monitored Anesthesia Care Procedure:                Pre-Anesthesia Assessment:                           - Prior to the procedure, a History and Physical                            was performed, and patient medications and                            allergies were reviewed. The patient's tolerance of                            previous anesthesia was also reviewed. The risks                            and benefits of the procedure and the sedation                            options and risks were discussed with the patient.                            All questions were answered, and informed consent                            was obtained. Prior Anticoagulants: The patient has                            taken no anticoagulant or antiplatelet agents. ASA                            Grade Assessment: II - A patient with mild systemic                            disease. After reviewing the risks and benefits,                            the patient was deemed in satisfactory condition to                            undergo the procedure.                           After obtaining informed consent, the endoscope was  passed under direct vision. Throughout the                            procedure, the patient's blood pressure, pulse, and                            oxygen saturations were monitored continuously. The                            GIF [id number] #[id number] was introduced through the                            mouth, and advanced to the second part of duodenum.                            The upper  GI endoscopy was accomplished without                            difficulty. The patient tolerated the procedure                            well. Scope In: Scope Out: Findings:                 The esophagus was normal.                           The stomach was normal. Small hiatal hernia.                            Biopsies were taken with a cold forceps, from the                            gastric antrum, for Helicobacter pylori testing via                            histology.                           The examined duodenum multiple superficial erosions                            in the bulb. The postbulbar duodenum was normal.                           The cardia and gastric fundus were normal on                            retroflexion. Complications:            No immediate complications. Estimated Blood Loss:     Estimated blood loss: none. Impression:               1. Duodenal erosions. Likely from NSAIDs (Advil).                           2. Otherwise  unremarkable exam. Recommendation:           1. Patient has a contact number available for                            emergencies. The signs and symptoms of potential                            delayed complications were discussed with the                            patient. Return to normal activities tomorrow.                            Written discharge instructions were provided to the                            patient.                           2. Resume previous diet.                           3. Continue present medications.                           4. Minimize NSAIDs (medications like Advil) to                            avoid the risk of ulcers. However, if these                            medicines are necessary, then could administer                            pantoprazole  40 mg.                           5. PRESCRIBE pantoprazole  40 mg daily; #30; 6                            refills. Please take 1 daily for 4 weeks.                             Thereafter, use as needed if taking NSAIDs                           6. Follow-up biopsies                           7. Return to the care of your primary provider Norleen SAILOR. Abran, MD 03/23/2023 10:54:18 AM This report has been signed electronically.

## 2023-03-23 NOTE — Patient Instructions (Addendum)
  Resume previous diet  Minimize NSAIDS- TAKE PANTOPROZOLE 40MG  DAILY; #30, 6 REFILLS. PLEASE TAKE 1 DAILY FOR 4 WEEKS. THEN USE AS NEEDED IF TAKING NSAIDS  Awaiting pathology results   YOU HAD AN ENDOSCOPIC PROCEDURE TODAY AT THE [facility]:   Refer to the procedure report that was given to you for any specific questions about what was found during the examination.  If the procedure report does not answer your questions, please call your gastroenterologist to clarify.  If you requested that your care partner not be given the details of your procedure findings, then the procedure report has been included in a sealed envelope for you to review at your convenience later.  YOU SHOULD EXPECT: Some feelings of bloating in the abdomen. Passage of more gas than usual.  Walking can help get rid of the air that was put into your GI tract during the procedure and reduce the bloating. If you had a lower endoscopy (such as a colonoscopy or flexible sigmoidoscopy) you may notice spotting of blood in your stool or on the toilet paper. If you underwent a bowel prep for your procedure, you may not have a normal bowel movement for a few days.  Please Note:  You might notice some irritation and congestion in your nose or some drainage.  This is from the oxygen used during your procedure.  There is no need for concern and it should clear up in a day or so.  SYMPTOMS TO REPORT IMMEDIATELY:  Following upper endoscopy (EGD)  Vomiting of blood or coffee ground material  New chest pain or pain under the shoulder blades  Painful or persistently difficult swallowing  New shortness of breath  Fever of 100F or higher  Black, tarry-looking stools  For urgent or emergent issues, a gastroenterologist can be reached at any hour by calling [phone]. Do not use MyChart messaging for urgent concerns.    DIET:  We do recommend a small meal at first, but then you may proceed to your regular diet.  Drink  plenty of fluids but you should avoid alcoholic beverages for 24 hours.  ACTIVITY:  You should plan to take it easy for the rest of today and you should NOT DRIVE or use heavy machinery until tomorrow (because of the sedation medicines used during the test).    FOLLOW UP: Our staff will call the number listed on your records the next business day following your procedure.  We will call around 7:15- 8:00 am to check on you and address any questions or concerns that you may have regarding the information given to you following your procedure. If we do not reach you, we will leave a message.     If any biopsies were taken you will be contacted by phone or by letter within the next 1-3 weeks.  Please call us  at [phone] if you have not heard about the biopsies in 3 weeks.    SIGNATURES/CONFIDENTIALITY: You and/or your care partner have signed paperwork which will be entered into your electronic medical record.  These signatures attest to the fact that that the information above on your After Visit Summary has been reviewed and is understood.  Full responsibility of the confidentiality of this discharge information lies with you and/or your care-partner.

## 2023-03-23 NOTE — Progress Notes (Signed)
 Called to room to assist during endoscopic procedure.  Patient ID and intended procedure confirmed with present staff. Received instructions for my participation in the procedure from the performing physician.

## 2023-03-24 ENCOUNTER — Telehealth: Payer: Self-pay | Admitting: *Deleted

## 2023-03-24 NOTE — Telephone Encounter (Signed)
 Called patient to follow up after 03/23/23 procedure. Left voicemail with callback number for any questions or concerns.

## 2023-03-26 LAB — SURGICAL PATHOLOGY

## 2023-03-27 ENCOUNTER — Encounter: Payer: Self-pay | Admitting: Internal Medicine

## 2023-04-13 ENCOUNTER — Other Ambulatory Visit: Payer: Self-pay | Admitting: Family Medicine

## 2023-04-14 ENCOUNTER — Other Ambulatory Visit: Payer: Self-pay | Admitting: Family Medicine

## 2023-04-14 NOTE — Telephone Encounter (Signed)
Copied from CRM [id number]. Topic: Clinical - Medication Refill >> Apr 14, 2023  4:12 PM Fuller Mandril wrote: Most Recent Primary Care Visit:  Provider: Lynnea Ferrier T  Department: [facility]  Visit Type: SAME DAY  Date: 12/10/2022  Medication:  hydrochlorothiazide (MICROZIDE) 12.5 MG capsule  valsartan (DIOVAN) 320 MG tablet   Has the patient contacted their pharmacy? Yes (Agent: If no, request that the patient contact the pharmacy for the refill. If patient does not wish to contact the pharmacy document the reason why and proceed with request.) (Agent: If yes, when and what did the pharmacy advise?) unable to fill  Is this the correct pharmacy for this prescription? Yes If no, delete pharmacy and type the correct one.  This is the patient's preferred pharmacy:  [id number] [place], [place] - [address] [place] [address] Phone: [phone] Fax: [phone]   Has the prescription been filled recently? No  Is the patient out of the medication? Yes  Has the patient been seen for an appointment in the last year OR does the patient have an upcoming appointment? Yes  Can we respond through MyChart? Yes  Agent: Please be advised that Rx refills may take up to 3 business days. We ask that you follow-up with your pharmacy.

## 2023-04-14 NOTE — Telephone Encounter (Signed)
Requested medication (s) are due for refill today: No  Requested medication (s) are on the active medication list: Yes  Last refill:  04/14/23  Future visit scheduled: No  Notes to clinic:  Unable to refill per protocol, last refill by another provider.      Requested Prescriptions  Pending Prescriptions Disp Refills   hydrochlorothiazide (MICROZIDE) 12.5 MG capsule 30 capsule 0    Sig: Take 1 capsule (12.5 mg total) by mouth daily.     Cardiovascular: Diuretics - Thiazide Failed - 04/14/2023  4:37 PM      Failed - Cr in normal range and within 180 days    Creat  Date Value Ref Range Status  10/08/2022 0.97 0.70 - 1.30 mg/dL Final         Failed - K in normal range and within 180 days    Potassium  Date Value Ref Range Status  10/08/2022 4.2 3.5 - 5.3 mmol/L Final         Failed - Na in normal range and within 180 days    Sodium  Date Value Ref Range Status  10/08/2022 142 135 - 146 mmol/L Final         Failed - Last BP in normal range    BP Readings from Last 1 Encounters:  03/23/23 (!) 168/92         Failed - Valid encounter within last 6 months    Recent Outpatient Visits           1 year ago RLQ abdominal pain   [facility] Donita Brooks, MD   1 year ago Suprapubic fullness   [facility] Donita Brooks, MD   2 years ago Primary hypertension   [facility] Tanya Nones, Priscille Heidelberg, MD   3 years ago Prostate cancer screening   [facility] Donita Brooks, MD   3 years ago Benign essential HTN   [facility] Pickard, Priscille Heidelberg, MD              Signed Prescriptions Disp Refills   hydrochlorothiazide (MICROZIDE) 12.5 MG capsule      Sig: Take 12.5 mg by mouth daily.     There is no refill protocol information for this order

## 2023-04-28 ENCOUNTER — Other Ambulatory Visit: Payer: Self-pay | Admitting: Family Medicine

## 2023-04-29 ENCOUNTER — Ambulatory Visit: Payer: Commercial Managed Care - PPO

## 2023-04-29 ENCOUNTER — Other Ambulatory Visit: Payer: Self-pay

## 2023-04-29 ENCOUNTER — Ambulatory Visit: Payer: Self-pay | Admitting: Family Medicine

## 2023-04-29 ENCOUNTER — Other Ambulatory Visit: Payer: Commercial Managed Care - PPO

## 2023-04-29 VITALS — BP 152/90

## 2023-04-29 DIAGNOSIS — I10 Essential (primary) hypertension: Secondary | ICD-10-CM

## 2023-04-29 MED ORDER — LISINOPRIL 40 MG PO TABS: 40.0000 mg | ORAL_TABLET | Freq: Every day | ORAL | 1 refills | Status: DC

## 2023-04-29 MED ORDER — HYDROCHLOROTHIAZIDE 25 MG PO TABS: 25.0000 mg | ORAL_TABLET | Freq: Every day | ORAL | 3 refills | Status: AC

## 2023-04-29 NOTE — Telephone Encounter (Signed)
Prescription Request  04/29/2023  LOV: 12/10/22  What is the name of the medication or equipment? lisinopril (ZESTRIL) 40 MG tablet [[id number]]   Have you contacted your pharmacy to request a refill? Yes   Which pharmacy would you like this sent to?  [id number] [place], [place] - [address] [place] [address] Phone: [phone] Fax: [phone]    Patient notified that their request is being sent to the clinical staff for review and that they should receive a response within 2 business days.   Please advise at [facility] [phone]

## 2023-04-29 NOTE — Telephone Encounter (Signed)
Copied from CRM [id number]. Topic: Clinical - Medication Question >> Apr 29, 2023  8:41 AM Louie Casa B wrote: Reason for CRM: patient wants to switch lisinopril (ZESTRIL) 40 MG tablet to something else and needs to start taking  hydrochlorothiazide (MICROZIDE) 12.5 MG again please call patient back [phone]

## 2023-04-29 NOTE — Progress Notes (Signed)
Patient is in office today for a nurse visit for Blood Pressure Check. Patient blood pressure was 152/90, Patient No chest pain. Pt seen by Dr. Tanya Nones who advised pt to increase hydrochlorothiazide to 25 mg per day.  Rx sent in as ordered. Pt to return in 2 weeks to see Dr. Tanya Nones for follow up. Mjp,lpn

## 2023-04-30 NOTE — Telephone Encounter (Signed)
Requested Prescriptions  Refused Prescriptions Disp Refills   lisinopril (ZESTRIL) 40 MG tablet      Sig: Take 1 tablet (40 mg total) by mouth daily.     Cardiovascular:  ACE Inhibitors Failed - 04/30/2023  7:59 AM      Failed - Cr in normal range and within 180 days    Creat  Date Value Ref Range Status  10/08/2022 0.97 0.70 - 1.30 mg/dL Final         Failed - K in normal range and within 180 days    Potassium  Date Value Ref Range Status  10/08/2022 4.2 3.5 - 5.3 mmol/L Final         Failed - Last BP in normal range    BP Readings from Last 1 Encounters:  04/29/23 (!) 152/90         Failed - Valid encounter within last 6 months    Recent Outpatient Visits           1 year ago RLQ abdominal pain   [facility] Donita Brooks, MD   1 year ago Suprapubic fullness   [facility] Donita Brooks, MD   2 years ago Primary hypertension   [facility] Tanya Nones, Priscille Heidelberg, MD   3 years ago Prostate cancer screening   [facility] Donita Brooks, MD   4 years ago Benign essential HTN   [facility] Pickard, Priscille Heidelberg, MD              Passed - Patient is not pregnant

## 2023-05-13 ENCOUNTER — Encounter: Payer: Self-pay | Admitting: Family Medicine

## 2023-05-13 ENCOUNTER — Ambulatory Visit (INDEPENDENT_AMBULATORY_CARE_PROVIDER_SITE_OTHER): Payer: Commercial Managed Care - PPO | Admitting: Family Medicine

## 2023-05-13 VITALS — BP 138/82 | HR 65 | Temp 98.4°F | Ht 71.0 in | Wt 217.2 lb

## 2023-05-13 DIAGNOSIS — I10 Essential (primary) hypertension: Secondary | ICD-10-CM

## 2023-05-13 NOTE — Progress Notes (Signed)
 Subjective:    Patient ID: Bryan Davenport, male    DOB: December 03, 1965, 58 y.o.   MRN: [id number]  Patient's blood pressure is consistently been in the 130s over 80s taking lisinopril 40 mg a day and hydrochlorothiazide 12.5 mg a day.  He states when he takes 25 mg of hydrochlorothiazide, his systolic blood pressure is 115 and he feels weak. Past Medical History:  Diagnosis Date   GERD (gastroesophageal reflux disease)    Hypertension    Kidney stone    Past Surgical History:  Procedure Laterality Date   COLONOSCOPY     INGUINAL HERNIA REPAIR Right    UPPER GASTROINTESTINAL ENDOSCOPY     Current Outpatient Medications on File Prior to Visit  Medication Sig Dispense Refill   Bacillus Coagulans-Inulin (PROBIOTIC) 1-250 BILLION-MG CAPS Take by mouth.     hydrochlorothiazide (HYDRODIURIL) 25 MG tablet Take 1 tablet (25 mg total) by mouth daily. 90 tablet 3   lisinopril (ZESTRIL) 40 MG tablet Take 1 tablet (40 mg total) by mouth daily. 90 tablet 1   Omega-3 Fatty Acids (FISH OIL) 500 MG CAPS Take by mouth.     pantoprazole (PROTONIX) 40 MG tablet Take 1 tablet (40 mg total) by mouth daily. 180 tablet 6   No current facility-administered medications on file prior to visit.   Allergies  Allergen Reactions   Sulfonamide Derivatives Rash   Social History   Socioeconomic History   Marital status: Married    Spouse name: Not on file   Number of children: 2   Years of education: Not on file   Highest education level: Not on file  Occupational History   Not on file  Tobacco Use   Smoking status: Never   Smokeless tobacco: Never  Vaping Use   Vaping status: Never Used  Substance and Sexual Activity   Alcohol use: No   Drug use: No   Sexual activity: Not on file  Other Topics Concern   Not on file  Social History Narrative   Not on file   Social Drivers of Health   Financial Resource Strain: Not on file  Food Insecurity: Not on file  Transportation Needs: Not on file   Physical Activity: Not on file  Stress: Not on file  Social Connections: Not on file  Intimate Partner Violence: Not on file      Review of Systems  Neurological:  Positive for headaches.  All other systems reviewed and are negative.      Objective:   Physical Exam Vitals reviewed.  Constitutional:      General: He is not in acute distress.    Appearance: He is well-developed. He is not diaphoretic.  HENT:     Head: Normocephalic and atraumatic.     Right Ear: External ear normal.     Left Ear: External ear normal.     Nose: Nose normal.     Mouth/Throat:     Pharynx: No oropharyngeal exudate.  Eyes:     Conjunctiva/sclera: Conjunctivae normal.     Pupils: Pupils are equal, round, and reactive to light.  Neck:     Thyroid: No thyromegaly.     Vascular: No JVD.  Cardiovascular:     Rate and Rhythm: Normal rate and regular rhythm.     Heart sounds: Normal heart sounds. No murmur heard.    No friction rub. No gallop.  Pulmonary:     Effort: Pulmonary effort is normal. No respiratory distress.     Breath  sounds: Normal breath sounds. No wheezing or rales.  Chest:     Chest wall: No tenderness.  Abdominal:     General: Bowel sounds are normal. There is no distension.     Palpations: Abdomen is soft. There is no mass.     Tenderness: There is no abdominal tenderness. There is no guarding or rebound.  Musculoskeletal:     Cervical back: Neck supple.  Lymphadenopathy:     Cervical: No cervical adenopathy.  Neurological:     Mental Status: He is alert and oriented to person, place, and time.     Cranial Nerves: No cranial nerve deficit.     Motor: No abnormal muscle tone.     Coordination: Coordination normal.     Deep Tendon Reflexes: Reflexes are normal and symmetric.           Assessment & Plan:  Benign essential HTN Blood pressure today is acceptable.  Continue lisinopril 40 mg a day and hydrochlorothiazide 12.5 mg today.  Strongly encouraged the patient  to take a statin

## 2023-10-28 ENCOUNTER — Other Ambulatory Visit: Payer: Self-pay | Admitting: Family Medicine

## 2023-10-28 DIAGNOSIS — I10 Essential (primary) hypertension: Secondary | ICD-10-CM

## 2024-04-20 ENCOUNTER — Telehealth: Payer: Self-pay

## 2024-04-20 ENCOUNTER — Other Ambulatory Visit: Payer: Self-pay

## 2024-04-20 DIAGNOSIS — I10 Essential (primary) hypertension: Secondary | ICD-10-CM

## 2024-04-20 MED ORDER — LISINOPRIL 40 MG PO TABS: 40.0000 mg | ORAL_TABLET | Freq: Every day | ORAL | 1 refills | Status: AC

## 2024-04-20 NOTE — Telephone Encounter (Signed)
 Copied from CRM [id number]. Topic: Clinical - Medication Refill >> Apr 20, 2024 10:34 AM Harlene ORN wrote: Medication: lisinopril  (ZESTRIL ) 40 MG tablet  Has the patient contacted their pharmacy? Yes (Agent: If no, request that the patient contact the pharmacy for the refill. If patient does not wish to contact the pharmacy document the reason why and proceed with request.) (Agent: If yes, when and what did the pharmacy advise?)  This is the patient's preferred pharmacy:  [id number] [place], [place] - [address] [place] [address] Phone: [phone] Fax: [phone]  Is this the correct pharmacy for this prescription? Yes If no, delete pharmacy and type the correct one.   Has the prescription been filled recently? No  Is the patient out of the medication? No  Has the patient been seen for an appointment in the last year OR does the patient have an upcoming appointment? No  Can we respond through MyChart? No  Agent: Please be advised that Rx refills may take up to 3 business days. We ask that you follow-up with your pharmacy.

## 2024-04-20 NOTE — Telephone Encounter (Signed)
 Copied from CRM [id number]. Topic: Clinical - Request for Lab/Test Order >> Apr 20, 2024 10:40 AM Harlene ORN wrote: Reason for CRM: Patient has been scheduled for 05/25/2024 @ 11:15 am. Please order fasting labs for the patient. Please call the patien back when orders are activated.

## 2024-04-20 NOTE — Telephone Encounter (Signed)
 Copied from CRM #[id number]. Topic: Appointments - Appointment Scheduling >> Apr 20, 2024 10:37 AM Harlene ORN wrote: Patient/patient representative is calling to schedule an appointment. Refer to attachments for appointment information.

## 2024-05-25 ENCOUNTER — Encounter: Admitting: Family Medicine
